# Patient Record
Sex: Male | Born: 1965
Health system: Southern US, Community
[De-identification: ages and names within clinical notes are randomized; demographics above are authoritative.]

## PROBLEM LIST (undated history)

## (undated) DIAGNOSIS — Z720 Tobacco use: Secondary | ICD-10-CM

## (undated) DIAGNOSIS — R002 Palpitations: Secondary | ICD-10-CM

## (undated) DIAGNOSIS — I10 Essential (primary) hypertension: Secondary | ICD-10-CM

## (undated) HISTORY — PX: LAPAROSCOPIC CHOLECYSTECTOMY: SUR755

## (undated) HISTORY — DX: Tobacco use: Z72.0

## (undated) HISTORY — DX: Essential (primary) hypertension: I10

## (undated) HISTORY — DX: Palpitations: R00.2

---

## 2008-04-11 ENCOUNTER — Emergency Department: Payer: Self-pay | Admitting: Emergency Medicine

## 2008-04-12 ENCOUNTER — Encounter: Payer: Self-pay | Admitting: Family Medicine

## 2008-04-14 ENCOUNTER — Encounter: Payer: Self-pay | Admitting: Family Medicine

## 2008-04-15 ENCOUNTER — Encounter: Payer: Self-pay | Admitting: Family Medicine

## 2008-04-17 ENCOUNTER — Encounter: Payer: Self-pay | Admitting: Family Medicine

## 2008-04-24 ENCOUNTER — Ambulatory Visit: Payer: Self-pay | Admitting: Family Medicine

## 2008-04-24 DIAGNOSIS — J309 Allergic rhinitis, unspecified: Secondary | ICD-10-CM | POA: Insufficient documentation

## 2008-04-24 DIAGNOSIS — R002 Palpitations: Secondary | ICD-10-CM | POA: Insufficient documentation

## 2008-04-24 DIAGNOSIS — F17200 Nicotine dependence, unspecified, uncomplicated: Secondary | ICD-10-CM | POA: Insufficient documentation

## 2008-04-29 ENCOUNTER — Encounter: Payer: Self-pay | Admitting: Family Medicine

## 2008-07-01 ENCOUNTER — Ambulatory Visit: Payer: Self-pay | Admitting: Family Medicine

## 2008-07-02 ENCOUNTER — Telehealth (INDEPENDENT_AMBULATORY_CARE_PROVIDER_SITE_OTHER): Payer: Self-pay | Admitting: *Deleted

## 2008-07-02 LAB — CONVERTED CEMR LAB
AST: 18 units/L (ref 0–37)
Alkaline Phosphatase: 51 units/L (ref 39–117)
Basophils Absolute: 0 10*3/uL (ref 0.0–0.1)
Chloride: 105 meq/L (ref 96–112)
Eosinophils Absolute: 0.1 10*3/uL (ref 0.0–0.7)
Eosinophils Relative: 1.3 % (ref 0.0–5.0)
GFR calc non Af Amer: 86 mL/min
Lymphocytes Relative: 34.1 % (ref 12.0–46.0)
MCHC: 35 g/dL (ref 30.0–36.0)
MCV: 97.3 fL (ref 78.0–100.0)
Neutrophils Relative %: 55.6 % (ref 43.0–77.0)
Platelets: 191 10*3/uL (ref 150–400)
Potassium: 4.3 meq/L (ref 3.5–5.1)
Total Bilirubin: 0.6 mg/dL (ref 0.3–1.2)
WBC: 5.8 10*3/uL (ref 4.5–10.5)

## 2008-07-11 ENCOUNTER — Ambulatory Visit: Payer: Self-pay | Admitting: Family Medicine

## 2008-07-11 DIAGNOSIS — J019 Acute sinusitis, unspecified: Secondary | ICD-10-CM | POA: Insufficient documentation

## 2008-07-25 ENCOUNTER — Ambulatory Visit: Payer: Self-pay | Admitting: Family Medicine

## 2008-07-26 ENCOUNTER — Encounter: Payer: Self-pay | Admitting: Family Medicine

## 2011-04-27 ENCOUNTER — Encounter: Payer: Self-pay | Admitting: Family Medicine

## 2011-04-27 ENCOUNTER — Ambulatory Visit (INDEPENDENT_AMBULATORY_CARE_PROVIDER_SITE_OTHER): Payer: Commercial Managed Care - PPO | Admitting: Family Medicine

## 2011-04-27 VITALS — BP 120/72 | HR 70 | Temp 97.9°F | Ht 72.0 in | Wt 230.0 lb

## 2011-04-27 DIAGNOSIS — J0100 Acute maxillary sinusitis, unspecified: Secondary | ICD-10-CM

## 2011-04-27 MED ORDER — AMOXICILLIN 500 MG PO CAPS: 1000.0000 mg | ORAL_CAPSULE | Freq: Two times a day (BID) | ORAL | Status: AC

## 2011-04-27 NOTE — Progress Notes (Signed)
  Subjective:    Patient ID: Nathan Santos, male    DOB: [birth year], 45 y.o.   MRN: [id number]  HPI  Nathan Santos, a 45 y.o. male presents today in the office for the following:    3 weeks has felt sick. Has a lot of sinus pressure. Took some claritin d this morning. Decongestants will make him jittery. Mostly with R ear pain and the face. Has had some sinus presure for a few weeks. Has had some fever.  Also had some cold feeling.   The PMH, PSH, Social History, Family History, Medications, and allergies have been reviewed in [facility], and have been updated if relevant.   Review of Systems     Objective:   Physical Exam        Assessment & Plan:   Subjective:     Nathan Santos is a 45 y.o. male who presents for evaluation of sinus pain. Symptoms include: congestion, epistaxis, facial pain, fevers, foul rhinorrhea, frequent clearing of the throat, headaches, mouth breathing, nasal congestion, post nasal drip, puffiness of the eyes, sinus pressure, sneezing, sniffing and tooth pain. Onset of symptoms was 3 weeks ago. Symptoms have been gradually worsening since that time. Past history is significant for no history of pneumonia or bronchitis. Patient is a non-smoker.  The following portions of the patient's history were reviewed and updated as appropriate: allergies, current medications, past family history, past medical history, past social history, past surgical history and problem list.  Review of Systems Pertinent items are noted in HPI.   Objective:    Physical Exam  Blood pressure 120/72, pulse 70, temperature 97.9 F (36.6 C), temperature source Oral, height 6' (1.829 m), weight 230 lb (104.327 kg), SpO2 98.00%.  Gen: WDWN, NAD; alert,appropriate and cooperative throughout exam  HEENT: Normocephalic and atraumatic. Throat clear, w/o exudate, no LAD, R TM clear, L TM - good landmarks, No fluid present. rhinnorhea.  Left frontal and maxillary sinuses: Tender max Right  frontal and maxillary sinuses: Tendermax  Neck: No ant or post LAD CV: RRR, No M/G/R Pulm: Breathing comfortably in no resp distress. no w/c/r Abd: S,NT,ND,+BS Extr: no c/c/e Psych: full affect, pleasant    Assessment:    Acute bacterial sinusitis.    Plan:    Nasal saline sprays. Amoxicillin per medication orders.

## 2011-09-02 ENCOUNTER — Other Ambulatory Visit: Payer: Self-pay | Admitting: Family Medicine

## 2011-09-02 DIAGNOSIS — Z79899 Other long term (current) drug therapy: Secondary | ICD-10-CM

## 2011-09-02 DIAGNOSIS — Z125 Encounter for screening for malignant neoplasm of prostate: Secondary | ICD-10-CM

## 2011-09-02 DIAGNOSIS — Z13220 Encounter for screening for lipoid disorders: Secondary | ICD-10-CM

## 2011-09-08 ENCOUNTER — Other Ambulatory Visit (INDEPENDENT_AMBULATORY_CARE_PROVIDER_SITE_OTHER): Payer: Commercial Managed Care - PPO

## 2011-09-08 DIAGNOSIS — Z79899 Other long term (current) drug therapy: Secondary | ICD-10-CM

## 2011-09-08 DIAGNOSIS — Z13220 Encounter for screening for lipoid disorders: Secondary | ICD-10-CM

## 2011-09-08 DIAGNOSIS — Z125 Encounter for screening for malignant neoplasm of prostate: Secondary | ICD-10-CM

## 2011-09-08 LAB — LIPID PANEL
Cholesterol: 192 mg/dL (ref 0–200)
LDL Cholesterol: 124 mg/dL — ABNORMAL HIGH (ref 0–99)
Total CHOL/HDL Ratio: 3
Triglycerides: 51 mg/dL (ref 0.0–149.0)

## 2011-09-08 LAB — CBC WITH DIFFERENTIAL/PLATELET
Basophils Absolute: 0 10*3/uL (ref 0.0–0.1)
Eosinophils Absolute: 0.1 10*3/uL (ref 0.0–0.7)
HCT: 48.5 % (ref 39.0–52.0)
Lymphs Abs: 2.1 10*3/uL (ref 0.7–4.0)
MCHC: 33.6 g/dL (ref 30.0–36.0)
MCV: 98.6 fl (ref 78.0–100.0)
Monocytes Absolute: 0.7 10*3/uL (ref 0.1–1.0)
Neutrophils Relative %: 70.7 % (ref 43.0–77.0)
Platelets: 193 10*3/uL (ref 150.0–400.0)
RDW: 13.5 % (ref 11.5–14.6)

## 2011-09-08 LAB — BASIC METABOLIC PANEL
BUN: 17 mg/dL (ref 6–23)
Chloride: 105 mEq/L (ref 96–112)
Creatinine, Ser: 0.7 mg/dL (ref 0.4–1.5)
Glucose, Bld: 107 mg/dL — ABNORMAL HIGH (ref 70–99)
Potassium: 4.7 mEq/L (ref 3.5–5.1)

## 2011-09-08 LAB — HEPATIC FUNCTION PANEL
AST: 19 U/L (ref 0–37)
Albumin: 4.2 g/dL (ref 3.5–5.2)
Alkaline Phosphatase: 70 U/L (ref 39–117)
Bilirubin, Direct: 0.1 mg/dL (ref 0.0–0.3)

## 2011-09-15 ENCOUNTER — Ambulatory Visit (INDEPENDENT_AMBULATORY_CARE_PROVIDER_SITE_OTHER): Payer: Commercial Managed Care - PPO | Admitting: Family Medicine

## 2011-09-15 ENCOUNTER — Encounter: Payer: Self-pay | Admitting: Family Medicine

## 2011-09-15 VITALS — BP 130/90 | HR 70 | Temp 98.4°F | Ht 71.0 in | Wt 243.8 lb

## 2011-09-15 DIAGNOSIS — Z0000 Encounter for general adult medical examination without abnormal findings: Secondary | ICD-10-CM

## 2011-09-15 MED ORDER — FLUTICASONE PROPIONATE 50 MCG/ACT NA SUSP: 2.0000 | Freq: Every day | NASAL | Status: DC

## 2011-09-15 MED ORDER — TADALAFIL 20 MG PO TABS: 20.0000 mg | ORAL_TABLET | Freq: Every day | ORAL | Status: AC | PRN

## 2011-09-15 NOTE — Progress Notes (Signed)
Patient Name: Nathan Santos Date of Birth: [birth year] Medical Record Number: [id number] Gender: male Date of Encounter: 09/15/2011  History of Present Illness:  Nathan Santos is a 46 y.o. very pleasant male patient who presents with the following:  Has been going on a separation and divorce and is feeling bad about all of the time. About a month ago, and saw some was given some biaxin, but never really gotten rid of it. Has some low energy.  6 months down and lower. Has been down some. Has some spells of depression, has been ongoing for about two years. Was exercising well, but since his energy down  218 - 243. Has all been put on in the last three months. Still has some congestion.  Wakes up 2 AM or ealier, and has a lot of stress at work. Will be on call  A lot. Works up to 55 hours a week. Up at 430 in the AM  Preventative Health Maintenance Visit:  Health Maintenance Summary Reviewed and updated, unless pt declines services.  Tobacco History Reviewed. Alcohol: No concerns, no excessive use Exercise Habits: rare STD concerns: no risk or activity to increase risk Drug Use: None Encouraged self-testicular check  Health Maintenance  Topic Date Due  . Influenza Vaccine  03/22/2011  . Tetanus/tdap  11/19/2017    Labs reviewed with the patient.   Lipids:    Component Value Date/Time   CHOL 192 09/08/2011 0802   TRIG 51.0 09/08/2011 0802   HDL 58.10 09/08/2011 0802   VLDL 10.2 09/08/2011 0802   CHOLHDL 3 09/08/2011 0802    CBC:    Component Value Date/Time   WBC 10.1 09/08/2011 0802   HGB 16.3 09/08/2011 0802   HCT 48.5 09/08/2011 0802   PLT 193.0 09/08/2011 0802   MCV 98.6 09/08/2011 0802   NEUTROABS 7.1 09/08/2011 0802   LYMPHSABS 2.1 09/08/2011 0802   MONOABS 0.7 09/08/2011 0802   EOSABS 0.1 09/08/2011 0802   BASOSABS 0.0 09/08/2011 0802    Basic Metabolic Panel:    Component Value Date/Time   NA 139 09/08/2011 0802   K 4.7 09/08/2011 0802   CL 105 09/08/2011 0802   CO2  27 09/08/2011 0802   BUN 17 09/08/2011 0802   CREATININE 0.7 09/08/2011 0802   GLUCOSE 107* 09/08/2011 0802   CALCIUM 9.6 09/08/2011 0802    Lab Results  Component Value Date   ALT 23 09/08/2011   AST 19 09/08/2011   ALKPHOS 70 09/08/2011   BILITOT 0.5 09/08/2011    Lab Results  Component Value Date   PSA 1.56 09/08/2011   PSA 0.90 07/01/2008     Patient Active Problem List  Diagnoses  . TOBACCO USE  . HYPERTENSION  . ALLERGIC RHINITIS  . PALPITATIONS   Past Medical History  Diagnosis Date  . Allergic rhinitis   . Palpitations   . Hypertension   . Tobacco abuse    Past Surgical History  Procedure Date  . Cholectomy   . Cardiovascular stress test   . US echocardiography    History  Substance Use Topics  . Smoking status: Former Games developer  . Smokeless tobacco: Not on file  . Alcohol Use: Yes   Family History  Problem Relation Age of Onset  . Heart disease Father    No Known Allergies  Medication list has been reviewed and updated.  Review of Systems:  General: Denies fever, chills, sweats. No significant weight loss. Some fatigue Eyes: Denies blurring,significant itching ENT: Denies earache,  sore throat, and hoarseness. STUFFINESS AND SINUS SYMPTOMS Cardiovascular: Denies chest pains, palpitations, dyspnea on exertion Respiratory: Denies cough, dyspnea at rest,wheeezing Breast: no concerns about lumps GI: Denies nausea, vomiting, diarrhea, constipation, change in bowel habits, abdominal pain, melena, hematochezia GU: Denies penile discharge, occ ED AND DECREASED LIBIDO, no urinary flow / outflow problems. No STD concerns. Musculoskeletal: Denies back pain, joint pain Derm: Denies rash, itching Neuro: Denies  paresthesias, frequent falls, frequent headaches Psych: has had some mild dep with sep and divorce, no si or hi. Doing thinigs with friends Endocrine: Denies cold intolerance, heat intolerance, polydipsia Heme: Denies enlarged lymph nodes Allergy: No  hayfever   Physical Examination: Filed Vitals:   09/15/11 1443  BP: 130/90  Pulse: 70  Temp: 98.4 F (36.9 C)  TempSrc: Oral  Height: 5\' 11"  (1.803 m)  Weight: 243 lb 12.8 oz (110.587 kg)  SpO2: 98%    Body mass index is 34.00 kg/(m^2).   Wt Readings from Last 3 Encounters:  09/15/11 243 lb 12.8 oz (110.587 kg)  04/27/11 230 lb (104.327 kg)  07/25/08 264 lb (119.75 kg)    GEN: well developed, well nourished, no acute distress Eyes: conjunctiva and lids normal, PERRLA, EOMI ENT: TM clear, nares clear, oral exam WNL Neck: supple, no lymphadenopathy, no thyromegaly, no JVD Pulm: clear to auscultation and percussion, respiratory effort normal CV: regular rate and rhythm, S1-S2, no murmur, rub or gallop, no bruits, peripheral pulses normal and symmetric, no cyanosis, clubbing, edema or varicosities Chest: no scars, masses GI: soft, non-tender; no hepatosplenomegaly, masses; active bowel sounds all quadrants GU: no hernia, testicular mass, penile discharge, or prostate enlargement Lymph: no cervical, axillary or inguinal adenopathy MSK: gait normal, muscle tone and strength WNL, no joint swelling, effusions, discoloration, crepitus  SKIN: clear, good turgor, color WNL, no rashes, lesions, or ulcerations Neuro: normal mental status, normal strength, sensation, and motion Psych: alert; oriented to person, place and time, normally interactive and not anxious or depressed in appearance.   Assessment and Plan:  1. Routine general medical examination at a health care facility    The patient's preventative maintenance and recommended screening tests for an annual wellness exam were reviewed in full today. Brought up to date unless services declined.  Counselled on the importance of diet, exercise, and its role in overall health and mortality. The patient's FH and SH was reviewed, including their home life, tobacco status, and drug and alcohol status.  Discussed increasing  activity.   Some fatigue. Decreased libido. We will do a trial of some Cialis. He asked and raised a question about testosterone. After discussing this, potential risks and benefits, for now we're not going to check his levels.  AR: start flonase and switch to allegra or zyrtec  Orders Today: No orders of the defined types were placed in this encounter.    Medications Today: Meds ordered this encounter  Medications  . loratadine (CLARITIN) 10 MG tablet    Sig: Take 10 mg by mouth daily.  . tadalafil (CIALIS) 20 MG tablet    Sig: Take 1 tablet (20 mg total) by mouth daily as needed for erectile dysfunction.    Dispense:  10 tablet    Refill:  11  . fluticasone (FLONASE) 50 MCG/ACT nasal spray    Sig: Place 2 sprays into the nose daily.    Dispense:  16 g    Refill:  12

## 2011-09-15 NOTE — Patient Instructions (Signed)
SWITCH TO ALLEGRA OR ZYRTEC

## 2011-09-17 ENCOUNTER — Encounter: Payer: Self-pay | Admitting: Family Medicine

## 2013-02-01 ENCOUNTER — Encounter: Payer: Self-pay | Admitting: Family Medicine

## 2013-02-01 ENCOUNTER — Ambulatory Visit (INDEPENDENT_AMBULATORY_CARE_PROVIDER_SITE_OTHER): Payer: Commercial Managed Care - PPO | Admitting: Family Medicine

## 2013-02-01 VITALS — BP 158/80 | HR 72 | Temp 98.4°F | Ht 71.0 in | Wt 256.0 lb

## 2013-02-01 DIAGNOSIS — IMO0002 Reserved for concepts with insufficient information to code with codable children: Secondary | ICD-10-CM

## 2013-02-01 DIAGNOSIS — M5416 Radiculopathy, lumbar region: Secondary | ICD-10-CM

## 2013-02-01 MED ORDER — PREDNISONE 20 MG PO TABS: ORAL_TABLET | ORAL | Status: DC

## 2013-02-01 NOTE — Progress Notes (Signed)
  Subjective:    Patient ID: Nathan Santos, male    DOB: 1966-06-08, 47 y.o.   MRN: [id number]  HPI 47 year old male pt of Dr. Cyndie Chime presents with new right lower back pain off and on in last 3 month, now 2 days ago, sudden worsening of back pain. Advil previously was helping, but now it is no longer helping.  Pain is 8/10 on pain scale.  Cannot get comfortable. Better if standing, worse if lying flat.    Now numbness tingling in right foot, now pain radiating into right buttock and right foot.  No incontinence. Right leg feels weaker.   No fever. No known injury trigger.  Has history of low back pain intermittantly, usually goes aeway on its own.   Review of Systems  Constitutional: Negative for fever and fatigue.  HENT: Negative for ear pain.   Eyes: Negative for pain.  Respiratory: Negative for shortness of breath.   Cardiovascular: Negative for chest pain.       Objective:   Physical Exam  Constitutional: Vital signs are normal. He appears well-developed and well-nourished.  HENT:  Head: Normocephalic.  Right Ear: Hearing normal.  Left Ear: Hearing normal.  Nose: Nose normal.  Mouth/Throat: Oropharynx is clear and moist and mucous membranes are normal.  Neck: Trachea normal. Carotid bruit is not present. No mass and no thyromegaly present.  Cardiovascular: Normal rate, regular rhythm and normal pulses.  Exam reveals no gallop, no distant heart sounds and no friction rub.   No murmur heard. No peripheral edema  Pulmonary/Chest: Effort normal and breath sounds normal. No respiratory distress.  Musculoskeletal:       Lumbar back: He exhibits decreased range of motion and tenderness. He exhibits no bony tenderness, no swelling and no deformity.  Positive SLR on right, neg faber's B  Neurological: He has normal strength and normal reflexes. No cranial nerve deficit or sensory deficit. He exhibits normal muscle tone. Gait abnormal. Coordination normal.  Skin: Skin is  warm, dry and intact. No rash noted.  Psychiatric: He has a normal mood and affect. His speech is normal and behavior is normal. Thought content normal.          Assessment & Plan:

## 2013-02-01 NOTE — Patient Instructions (Addendum)
Remain out of work until Monday. 8/18. Upon return no lifting greater than 10 lbs, no repetitive twisting.  Start home physical therapy, Heat on back and steroid course.  Call if not improving as expected or if symptoms not resolved in 2 weeks F/U with PCP.

## 2013-02-08 DIAGNOSIS — M5416 Radiculopathy, lumbar region: Secondary | ICD-10-CM | POA: Insufficient documentation

## 2013-02-08 NOTE — Assessment & Plan Note (Signed)
Remain out of work until Monday. 8/18. Upon return no lifting greater than 10 lbs, no repetitive twisting.  Start home physical therapy, Heat on back and steroid course.  Call if not improving as expected or if symptoms not resolved in 2 weeks F/U with PCP. 

## 2013-07-26 ENCOUNTER — Observation Stay: Payer: Self-pay | Admitting: Internal Medicine

## 2013-07-26 LAB — CBC
HCT: 47.8 % (ref 40.0–52.0)
HGB: 16.1 g/dL (ref 13.0–18.0)
MCH: 32.5 pg (ref 26.0–34.0)
MCHC: 33.8 g/dL (ref 32.0–36.0)
MCV: 96 fL (ref 80–100)
Platelet: 234 10*3/uL (ref 150–440)
RBC: 4.97 10*6/uL (ref 4.40–5.90)
RDW: 13.2 % (ref 11.5–14.5)
WBC: 7.3 10*3/uL (ref 3.8–10.6)

## 2013-07-26 LAB — BASIC METABOLIC PANEL
ANION GAP: 8 (ref 7–16)
BUN: 15 mg/dL (ref 7–18)
CHLORIDE: 105 mmol/L (ref 98–107)
Calcium, Total: 9.2 mg/dL (ref 8.5–10.1)
Co2: 24 mmol/L (ref 21–32)
Creatinine: 0.81 mg/dL (ref 0.60–1.30)
Glucose: 94 mg/dL (ref 65–99)
OSMOLALITY: 274 (ref 275–301)
Potassium: 4.4 mmol/L (ref 3.5–5.1)
SODIUM: 137 mmol/L (ref 136–145)

## 2013-07-26 LAB — TROPONIN I: Troponin-I: <0.02 ng/mL

## 2013-07-27 DIAGNOSIS — R079 Chest pain, unspecified: Secondary | ICD-10-CM

## 2013-07-27 LAB — LIPID PANEL
CHOLESTEROL: 192 mg/dL (ref 0–200)
HDL: 47 mg/dL (ref 40–60)
LDL CHOLESTEROL, CALC: 117 mg/dL — AB (ref 0–100)
Triglycerides: 142 mg/dL (ref 0–200)
VLDL Cholesterol, Calc: 28 mg/dL (ref 5–40)

## 2013-07-27 LAB — TROPONIN I

## 2013-08-06 ENCOUNTER — Encounter: Payer: Self-pay | Admitting: Family Medicine

## 2013-08-06 ENCOUNTER — Ambulatory Visit (INDEPENDENT_AMBULATORY_CARE_PROVIDER_SITE_OTHER): Payer: Commercial Managed Care - PPO | Admitting: Family Medicine

## 2013-08-06 VITALS — BP 144/80 | HR 72 | Temp 97.5°F | Ht 71.0 in | Wt 263.5 lb

## 2013-08-06 DIAGNOSIS — I10 Essential (primary) hypertension: Secondary | ICD-10-CM

## 2013-08-06 DIAGNOSIS — R079 Chest pain, unspecified: Secondary | ICD-10-CM

## 2013-08-06 NOTE — Progress Notes (Signed)
Pre-visit discussion using our clinic review tool. No additional management support is needed unless otherwise documented below in the visit note.  

## 2013-08-07 NOTE — Progress Notes (Signed)
Date:  08/06/2013   Name:  Nathan Santos   DOB:  02/15/1966   MRN:  [id number] Gender: male Age: 48 y.o.  Primary Physician:  Owens Loffler, MD   Chief Complaint: Hospitalization Follow-up   Subjective:   History of Present Illness:  Nathan Santos is a 48 y.o. pleasant patient who presents with the following:  Recent admission for chest pain rule out at [facility] within the last 2 weeks. D/C summary reviewed. S/p CE neg x 3, nuclear stress test, exercise that was low risk and otherwise normal. D/c with dx of MSK chest pain.  He had been doing a lot of strenuous activity prior to admission.   BP has been elevated persistently, he has gained weight, and he has been resistant to go on any medication.  Patient Active Problem List   Diagnosis Date Noted  . Acute radicular low back pain 02/08/2013  . TOBACCO USE 04/24/2008  . HYPERTENSION 04/24/2008  . ALLERGIC RHINITIS 04/24/2008    Past Medical History  Diagnosis Date  . Allergic rhinitis   . Palpitations   . Hypertension   . Tobacco abuse     Past Surgical History  Procedure Laterality Date  . Laparoscopic cholecystectomy      History   Social History  . Marital Status: Married    Spouse Name: N/A    Number of Children: 1  . Years of Education: N/A   Occupational History  . PLASTICS RECYCLING    Social History Main Topics  . Smoking status: Former Smoker    Quit date: 08/06/2010  . Smokeless tobacco: Not on file  . Alcohol Use: Yes  . Drug Use: No  . Sexual Activity: Not on file   Other Topics Concern  . Not on file   Social History Narrative   REGULAR EXERCISE- YES    Family History  Problem Relation Age of Onset  . Heart disease Father     No Known Allergies  Medication list has been reviewed and updated.  Review of Systems:   GEN: No acute illnesses, no fevers, chills. GI: No n/v/d, eating normally Pulm: No SOB Interactive and getting along well at home.  Otherwise, ROS is as per  the HPI.  Objective:   Physical Examination: BP 144/80  Pulse 72  Temp(Src) 97.5 F (36.4 C) (Oral)  Ht 5\' 11"  (1.803 m)  Wt 263 lb 8 oz (119.523 kg)  BMI 36.77 kg/m2  SpO2 94%  Ideal Body Weight: Weight in (lb) to have BMI = 25: 178.9   GEN: WDWN, NAD, Non-toxic, A & O x 3 HEENT: Atraumatic, Normocephalic. Neck supple. No masses, No LAD. Ears and Nose: No external deformity. CV: RRR, No M/G/R. No JVD. No thrill. No extra heart sounds. PULM: CTA B, no wheezes, crackles, rhonchi. No retractions. No resp. distress. No accessory muscle use. EXTR: No c/c/e NEURO Normal gait.  PSYCH: Normally interactive. Conversant. Not depressed or anxious appearing.  Calm demeanor.   Laboratory and Imaging Data:  Assessment & Plan:    Chest pain  HYPERTENSION  MSK chest pain HTN, trial of weight loss, f/u spring/summer  Signed,  Timberly Yott T. Kaymarie Wynn, MD, [place] [facility] [place] [address] Phone: [phone] Fax: [phone]    Medication List       This list is accurate as of: 08/06/13 11:59 PM.  Always use your most recent med list.  CLARITIN 10 MG tablet  Generic drug:  loratadine  Take 10 mg by mouth daily.     multivitamin tablet  Take 1 tablet by mouth daily.

## 2013-08-08 ENCOUNTER — Telehealth: Payer: Self-pay | Admitting: Family Medicine

## 2013-08-08 NOTE — Telephone Encounter (Signed)
Relevant patient education mailed to patient.  

## 2013-12-07 ENCOUNTER — Encounter: Payer: Self-pay | Admitting: Family Medicine

## 2013-12-07 ENCOUNTER — Ambulatory Visit (INDEPENDENT_AMBULATORY_CARE_PROVIDER_SITE_OTHER)
Admission: RE | Admit: 2013-12-07 | Discharge: 2013-12-07 | Disposition: A | Discharge status: Home / Self Care | Payer: Commercial Managed Care - PPO | Source: Ambulatory Visit | Attending: Family Medicine | Admitting: Family Medicine

## 2013-12-07 ENCOUNTER — Ambulatory Visit (INDEPENDENT_AMBULATORY_CARE_PROVIDER_SITE_OTHER): Payer: Commercial Managed Care - PPO | Admitting: Family Medicine

## 2013-12-07 VITALS — BP 150/80 | HR 70 | Temp 98.3°F | Wt 255.0 lb

## 2013-12-07 DIAGNOSIS — M25561 Pain in right knee: Secondary | ICD-10-CM

## 2013-12-07 DIAGNOSIS — M25569 Pain in unspecified knee: Secondary | ICD-10-CM

## 2013-12-07 MED ORDER — DICLOFENAC SODIUM 75 MG PO TBEC: 75.0000 mg | DELAYED_RELEASE_TABLET | Freq: Two times a day (BID) | ORAL | Status: DC

## 2013-12-07 MED ORDER — HYDROCODONE-ACETAMINOPHEN 5-325 MG PO TABS: 1.0000 | ORAL_TABLET | Freq: Four times a day (QID) | ORAL | Status: DC | PRN

## 2013-12-07 NOTE — Progress Notes (Signed)
Pre visit review using our clinic review tool, if applicable. No additional management support is needed unless otherwise documented below in the visit note. 

## 2013-12-07 NOTE — Progress Notes (Signed)
   Subjective:    Patient ID: Rushawn Capshaw, male    DOB: 1965/09/11, 48 y.o.   MRN: [id number]  Knee Pain      48 year old male pt of Dr. Lillie Fragmin presents with new onset  Right knee pain. He noted pain after twisting right knee after weed eating.  He has been wearing ace bandage and using sports cream. He had mild pain until yesterday.  Right knee gave out. Now pain much worse and swelling and redness in anterior knee. Hears grinding and increased pain with flexion of knee. Popping felt with bending knee.  Has taken motrin 2-3  for pain which has not helped.  No past knee surgery or past injuries.      Review of Systems  Constitutional: Negative for fever and fatigue.  HENT: Negative for ear pain.   Eyes: Negative for pain.  Respiratory: Negative for cough and shortness of breath.   Cardiovascular: Negative for chest pain and leg swelling.  Gastrointestinal: Negative for abdominal pain.       Objective:   Physical Exam  Constitutional: Vital signs are normal. He appears well-developed and well-nourished.  HENT:  Head: Normocephalic.  Right Ear: Hearing normal.  Left Ear: Hearing normal.  Nose: Nose normal.  Mouth/Throat: Oropharynx is clear and moist and mucous membranes are normal.  Neck: Trachea normal. Carotid bruit is not present. No mass and no thyromegaly present.  Cardiovascular: Normal rate, regular rhythm and normal pulses.  Exam reveals no gallop, no distant heart sounds and no friction rub.   No murmur heard. No peripheral edema  Pulmonary/Chest: Effort normal and breath sounds normal. No respiratory distress.  Musculoskeletal:       Right knee: He exhibits decreased range of motion, swelling and abnormal meniscus. He exhibits no effusion and no bony tenderness. Tenderness found. Medial joint line tenderness noted. No MCL, no LCL and no patellar tendon tenderness noted.  Positive Mc Murray's  Skin: Skin is warm, dry and intact. No rash noted.    Psychiatric: He has a normal mood and affect. His speech is normal and behavior is normal. Thought content normal.          Assessment & Plan:

## 2013-12-07 NOTE — Addendum Note (Signed)
Addended by: Eliezer Lofts E on: 12/07/2013 10:54 AM   Modules accepted: Orders

## 2013-12-07 NOTE — Patient Instructions (Addendum)
  Work restrictions: Avoid positions and activities that place excessive pressure on the knee joint until pain and swelling resolve. Such activities include: squatting, kneeling, twisting and pivoting, repetitive bending   No heavy lifting.  Apply ice to the knee for 15 minutes every four to six hours, while keeping the leg elevated   Start diclofenac twice daily for inflammation and pain. Use vicodin for breakthrough pain   Follow up with Dr. Diona Browner or Dr. Lorelei Pont in 2 weeks, call sooner if pain severe.

## 2013-12-07 NOTE — Assessment & Plan Note (Signed)
Concerning for meniscal tear given positive McMurray's. Will eval with x-ray. Start NSAID and vicodin for breakthrough pain.  Wear knee immobilizer.  Follow up in 2 weeks, call eaerlier if pain not improving.

## 2013-12-17 ENCOUNTER — Other Ambulatory Visit: Payer: Self-pay

## 2013-12-17 MED ORDER — HYDROCODONE-ACETAMINOPHEN 5-325 MG PO TABS: 1.0000 | ORAL_TABLET | Freq: Four times a day (QID) | ORAL | Status: DC | PRN

## 2013-12-17 NOTE — Telephone Encounter (Signed)
Pt left v/m requesting rx hydrocodone apap for knee pain. Call when ready for pick up.

## 2013-12-17 NOTE — Telephone Encounter (Signed)
Left message for Agustine that prescription is ready to be picked up at front desk.

## 2013-12-19 ENCOUNTER — Ambulatory Visit: Payer: Commercial Managed Care - PPO | Admitting: Family Medicine

## 2013-12-26 ENCOUNTER — Ambulatory Visit: Payer: Commercial Managed Care - PPO | Admitting: Family Medicine

## 2014-04-24 ENCOUNTER — Encounter: Payer: Commercial Managed Care - PPO | Admitting: Family Medicine

## 2014-06-03 ENCOUNTER — Encounter: Payer: Self-pay | Admitting: *Deleted

## 2014-06-03 ENCOUNTER — Ambulatory Visit (INDEPENDENT_AMBULATORY_CARE_PROVIDER_SITE_OTHER)
Admission: RE | Admit: 2014-06-03 | Discharge: 2014-06-03 | Disposition: A | Discharge status: Home / Self Care | Payer: Commercial Managed Care - PPO | Source: Ambulatory Visit | Attending: Family Medicine | Admitting: Family Medicine

## 2014-06-03 ENCOUNTER — Encounter: Payer: Self-pay | Admitting: Family Medicine

## 2014-06-03 ENCOUNTER — Ambulatory Visit (INDEPENDENT_AMBULATORY_CARE_PROVIDER_SITE_OTHER): Payer: Commercial Managed Care - PPO | Admitting: Family Medicine

## 2014-06-03 VITALS — BP 130/78 | HR 78 | Temp 99.2°F | Wt 258.5 lb

## 2014-06-03 DIAGNOSIS — R1031 Right lower quadrant pain: Secondary | ICD-10-CM

## 2014-06-03 LAB — POCT URINALYSIS DIPSTICK
Bilirubin, UA: NEGATIVE
Glucose, UA: NEGATIVE
Ketones, UA: NEGATIVE
LEUKOCYTES UA: NEGATIVE
NITRITE UA: NEGATIVE
PROTEIN UA: POSITIVE
Spec Grav, UA: >=1.03
Urobilinogen, UA: 0.2
pH, UA: 6

## 2014-06-03 MED ORDER — NAPROXEN 500 MG PO TABS: ORAL_TABLET | ORAL | Status: DC

## 2014-06-03 MED ORDER — TAMSULOSIN HCL 0.4 MG PO CAPS: 0.4000 mg | ORAL_CAPSULE | Freq: Every day | ORAL | Status: DC

## 2014-06-03 NOTE — Patient Instructions (Addendum)
Urine check today and labwork today. I think you have kidney stone. Push water, may use naprosyn twice daily with food for discomfort. Also may start flomax which may help pass stone quicker. Xray of kidneys today. If worsening pain please seek urgent care at ER. If not improving with above treatment, call us for CT scan.

## 2014-06-03 NOTE — Progress Notes (Signed)
BP 130/78 mmHg  Pulse 78  Temp(Src) 99.2 F (37.3 C) (Oral)  Wt 258 lb 8 oz (117.255 kg)   CC: R abd pain  Subjective:    Patient ID: Nathan Santos, male    DOB: 05-May-1966, 48 y.o.   MRN: [id number]  HPI: Nathan Santos is a 48 y.o. male presenting on 06/03/2014 for pain lower right side   3-4d h/o sharp RLQ pain with radiation to groin. Stayed uncomfortable all weekend - this morning progressively worsened. This has been associated with nausea. Initially felt feverish and had diarrhea but that has since resolved. He does endorse more irregular stools over last 2 weeks. Some back pain - earlier in the week was more active in yard bagging leaves. Wonders if he pulled a muscle.  No fevers/chills, blood in stool, blood in urine, urgency, hematuria, frequency, dysuria, vomiting.   H/o laparoscopic cholecystectomy for gallstones - this feels similar. No h/o kidney stones. No fmhx colon cancer  Relevant past medical, surgical, family and social history reviewed and updated as indicated. Interim medical history since our last visit reviewed. Allergies and medications reviewed and updated.  Current Outpatient Prescriptions on File Prior to Visit  Medication Sig  . Multiple Vitamin (MULTIVITAMIN) tablet Take 1 tablet by mouth daily.    . diclofenac (VOLTAREN) 75 MG EC tablet Take 1 tablet (75 mg total) by mouth 2 (two) times daily. (Patient not taking: Reported on 06/03/2014)  . HYDROcodone-acetaminophen (NORCO/VICODIN) 5-325 MG per tablet Take 1 tablet by mouth every 6 (six) hours as needed for moderate pain. (Patient not taking: Reported on 06/03/2014)  . loratadine (CLARITIN) 10 MG tablet Take 10 mg by mouth daily.   No current facility-administered medications on file prior to visit.    Review of Systems Per HPI unless specifically indicated above     Objective:    BP 130/78 mmHg  Pulse 78  Temp(Src) 99.2 F (37.3 C) (Oral)  Wt 258 lb 8 oz (117.255 kg)  Wt Readings  from Last 3 Encounters:  06/03/14 258 lb 8 oz (117.255 kg)  12/07/13 255 lb (115.667 kg)  08/06/13 263 lb 8 oz (119.523 kg)   Physical Exam  Constitutional: He appears well-developed and well-nourished. No distress.  Normal gait (not antalgic), nontoxic.  HENT:  Mouth/Throat: Oropharynx is clear and moist. No oropharyngeal exudate.  Eyes: Conjunctivae and EOM are normal. Pupils are equal, round, and reactive to light. No scleral icterus.  Cardiovascular: Normal rate, regular rhythm, normal heart sounds and intact distal pulses.   No murmur heard. Pulmonary/Chest: Effort normal and breath sounds normal. No respiratory distress. He has no wheezes. He has no rales.  Abdominal: Soft. Normal appearance. He exhibits no distension and no mass. Bowel sounds are increased. There is no hepatosplenomegaly. There is tenderness (moderate) in the right lower quadrant. There is no rigidity, no rebound, no guarding, no CVA tenderness and negative Murphy's sign. No hernia. Hernia confirmed negative in the right inguinal area and confirmed negative in the left inguinal area.  Musculoskeletal: He exhibits no edema.  Skin: Skin is warm and dry. No rash noted.  Psychiatric: He has a normal mood and affect.  Nursing note and vitals reviewed.     Assessment & Plan:   Problem List Items Addressed This Visit    Right lower quadrant abdominal pain - Primary    Does not have an acute abdomen today. Not consistent with acute appendicitis given duration of sxs.  Check UA today - 1+  blood, 3-5 RBC/hpf Check CMP, CBC today. Given hematuria noted today anticipate kidney stone. Treat with flomax and naprosyn anti inflammatory, push fluids. If worsening discussed in detail need to seek ER care. If not improving with treatment, I asked him to contact us to schedule CT scan to further eval RLQ abd pain - eval for diverticulitis vs neoplasm vs other. Pt agrees with plan.  KUB today - no obvious kidney stone noted     Relevant Orders      Comprehensive metabolic panel      CBC with Differential      Urinalysis Dipstick (Completed)      DG Abd 1 View      Follow up plan: No Follow-up on file.

## 2014-06-03 NOTE — Progress Notes (Signed)
Pre visit review using our clinic review tool, if applicable. No additional management support is needed unless otherwise documented below in the visit note. 

## 2014-06-03 NOTE — Assessment & Plan Note (Addendum)
Does not have an acute abdomen today. Not consistent with acute appendicitis given duration of sxs.  Check UA today - 1+ blood, 3-5 RBC/hpf Check CMP, CBC today. Given hematuria noted today anticipate kidney stone. Treat with flomax and naprosyn anti inflammatory, push fluids. If worsening discussed in detail need to seek ER care. If not improving with treatment, I asked him to contact us to schedule CT scan to further eval RLQ abd pain - eval for diverticulitis vs neoplasm vs other. Pt agrees with plan.  KUB today - no obvious kidney stone noted

## 2014-06-04 ENCOUNTER — Other Ambulatory Visit: Payer: Self-pay | Admitting: Family Medicine

## 2014-06-04 ENCOUNTER — Telehealth: Payer: Self-pay | Admitting: Family Medicine

## 2014-06-04 LAB — COMPREHENSIVE METABOLIC PANEL
ALBUMIN: 4.1 g/dL (ref 3.5–5.2)
ALK PHOS: 77 U/L (ref 39–117)
ALT: 18 U/L (ref 0–53)
AST: 14 U/L (ref 0–37)
BUN: 19 mg/dL (ref 6–23)
CALCIUM: 9.5 mg/dL (ref 8.4–10.5)
CHLORIDE: 103 meq/L (ref 96–112)
CO2: 28 mEq/L (ref 19–32)
Creatinine, Ser: 1.1 mg/dL (ref 0.4–1.5)
GFR: 72.96 mL/min (ref >60.00)
GLUCOSE: 93 mg/dL (ref 70–99)
POTASSIUM: 4.4 meq/L (ref 3.5–5.1)
SODIUM: 137 meq/L (ref 135–145)
TOTAL PROTEIN: 7.3 g/dL (ref 6.0–8.3)
Total Bilirubin: 0.5 mg/dL (ref 0.2–1.2)

## 2014-06-04 LAB — CBC WITH DIFFERENTIAL/PLATELET
Basophils Absolute: 0.1 10*3/uL (ref 0.0–0.1)
Basophils Relative: 0.7 % (ref 0.0–3.0)
EOS PCT: 1.6 % (ref 0.0–5.0)
Eosinophils Absolute: 0.2 10*3/uL (ref 0.0–0.7)
HCT: 48.6 % (ref 39.0–52.0)
Hemoglobin: 16 g/dL (ref 13.0–17.0)
LYMPHS ABS: 2.3 10*3/uL (ref 0.7–4.0)
LYMPHS PCT: 15.9 % (ref 12.0–46.0)
MCHC: 33 g/dL (ref 30.0–36.0)
MCV: 97.9 fl (ref 78.0–100.0)
Monocytes Absolute: 0.6 10*3/uL (ref 0.1–1.0)
Monocytes Relative: 4.5 % (ref 3.0–12.0)
NEUTROS ABS: 10.9 10*3/uL — AB (ref 1.4–7.7)
NEUTROS PCT: 77.3 % — AB (ref 43.0–77.0)
PLATELETS: 229 10*3/uL (ref 150.0–400.0)
RBC: 4.96 Mil/uL (ref 4.22–5.81)
RDW: 13.3 % (ref 11.5–15.5)
WBC: 14.1 10*3/uL — ABNORMAL HIGH (ref 4.0–10.5)

## 2014-06-04 MED ORDER — AMOXICILLIN-POT CLAVULANATE 875-125 MG PO TABS: 1.0000 | ORAL_TABLET | Freq: Two times a day (BID) | ORAL | Status: AC

## 2014-06-04 NOTE — Telephone Encounter (Signed)
emmi mailed  °

## 2014-07-01 ENCOUNTER — Encounter: Payer: Self-pay | Admitting: Gastroenterology

## 2014-07-01 ENCOUNTER — Ambulatory Visit (INDEPENDENT_AMBULATORY_CARE_PROVIDER_SITE_OTHER): Payer: Commercial Managed Care - PPO | Admitting: Family Medicine

## 2014-07-01 ENCOUNTER — Encounter: Payer: Self-pay | Admitting: Family Medicine

## 2014-07-01 VITALS — BP 130/78 | HR 71 | Temp 97.7°F | Ht 69.5 in | Wt 257.5 lb

## 2014-07-01 DIAGNOSIS — Z1211 Encounter for screening for malignant neoplasm of colon: Secondary | ICD-10-CM

## 2014-07-01 DIAGNOSIS — N5201 Erectile dysfunction due to arterial insufficiency: Secondary | ICD-10-CM

## 2014-07-01 DIAGNOSIS — R6882 Decreased libido: Secondary | ICD-10-CM

## 2014-07-01 DIAGNOSIS — Z23 Encounter for immunization: Secondary | ICD-10-CM

## 2014-07-01 DIAGNOSIS — Z0000 Encounter for general adult medical examination without abnormal findings: Secondary | ICD-10-CM

## 2014-07-01 DIAGNOSIS — Z13220 Encounter for screening for lipoid disorders: Secondary | ICD-10-CM

## 2014-07-01 DIAGNOSIS — Z125 Encounter for screening for malignant neoplasm of prostate: Secondary | ICD-10-CM

## 2014-07-01 MED ORDER — SILDENAFIL CITRATE 20 MG PO TABS: ORAL_TABLET | ORAL | Status: DC

## 2014-07-01 NOTE — Progress Notes (Signed)
Dr. Frederico Hamman T. Rathana Viveros, MD, [facility] [place], [address] Phone: [phone] Fax: [phone]  07/01/2014  Patient: Nathan Santos, MRN: [id number], DOB: 02/28/1966, 49 y.o.  Primary Physician:  Owens Loffler, MD  Chief Complaint: Annual Exam  Subjective:   Nathan Santos is a 48 y.o. pleasant patient who presents with the following:  Preventative Health Maintenance Visit:  Health Maintenance Summary Reviewed and updated, unless pt declines services.  Tobacco History Reviewed. Alcohol: No concerns, no excessive use Exercise Habits: Some activity, rec at least 30 mins 5 times a week STD concerns: no risk or activity to increase risk Drug Use: None Encouraged self-testicular check  Health Maintenance  Topic Date Due  . COLONOSCOPY  04/06/2014  . INFLUENZA VACCINE  01/20/2015  . TETANUS/TDAP  11/19/2017   Immunization History  Administered Date(s) Administered  . Influenza,inj,Quad PF,36+ Mos 07/01/2014  . Td 11/20/2007   Fully better now. He recently had what sounds like he was having a kidney stone, and he thinks he passed these in size known and then some smaller stones later after this.  He subsequently completely got better.  Last ate around 11-11:30?  Check PSA and check LDL.  He has a number of questions regarding libido and low testosterone.  He is seen some advertisements on TV and wonders if he has low testosterone, and he thinks that the descriptions that he is seeing on TV and in magazines and other things that he has read fit the sensations that he is having.  In the past I have given him some Cialis, and this did seem to work quite a bit for him in terms of erectile performance. Decreased libido.  Interested in some Cialis.   ?? Test ??  Patient Active Problem List   Diagnosis Date Noted  . Erectile dysfunction due to arterial insufficiency 07/02/2014  . Right lower quadrant abdominal  pain 06/03/2014  . Right anterior knee pain,concerning for meniscal tear 12/07/2013  . Acute radicular low back pain 02/08/2013  . TOBACCO USE 04/24/2008  . HYPERTENSION 04/24/2008  . ALLERGIC RHINITIS 04/24/2008   Past Medical History  Diagnosis Date  . Allergic rhinitis   . Palpitations   . Hypertension   . Tobacco abuse    Past Surgical History  Procedure Laterality Date  . Laparoscopic cholecystectomy     History   Social History  . Marital Status: Married    Spouse Name: N/A    Number of Children: 1  . Years of Education: N/A   Occupational History  . [employer]    Social History Main Topics  . Smoking status: Current Some Day Smoker  . Smokeless tobacco: Never Used     Comment: smokes mainly on the weekends  . Alcohol Use: 0.0 oz/week    0 Not specified per week  . Drug Use: No  . Sexual Activity: Not on file   Other Topics Concern  . Not on file   Social History Narrative   REGULAR EXERCISE- YES   Family History  Problem Relation Age of Onset  . Heart disease Father   . Colon cancer Neg Hx    No Known Allergies  Medication list has been reviewed and updated.   General: Denies fever, chills, sweats. No significant weight loss. Eyes: Denies blurring,significant itching ENT: Denies earache, sore throat, and hoarseness. Cardiovascular: Denies chest pains, palpitations, dyspnea on exertion Respiratory: Denies cough, dyspnea at rest,wheeezing Breast: no  concerns about lumps GI: Denies nausea, vomiting, diarrhea, constipation, change in bowel habits, abdominal pain, melena, hematochezia GU: Denies penile discharge, AS ABOVE ED, urinary flow / outflow problems. No STD concerns. Musculoskeletal: Denies back pain, joint pain Derm: Denies rash, itching Neuro: Denies  paresthesias, frequent falls, frequent headaches Psych: Denies depression, anxiety Endocrine: Denies cold intolerance, heat intolerance, polydipsia Heme: Denies enlarged lymph  nodes Allergy: No hayfever  Objective:   BP 130/78 mmHg  Pulse 71  Temp(Src) 97.7 F (36.5 C) (Oral)  Ht 5' 9.5" (1.765 m)  Wt 257 lb 8 oz (116.801 kg)  BMI 37.49 kg/m2 Ideal Body Weight: Weight in (lb) to have BMI = 25: 171.4  No exam data present  GEN: well developed, well nourished, no acute distress Eyes: conjunctiva and lids normal, PERRLA, EOMI ENT: TM clear, nares clear, oral exam WNL Neck: supple, no lymphadenopathy, no thyromegaly, no JVD Pulm: clear to auscultation and percussion, respiratory effort normal CV: regular rate and rhythm, S1-S2, no murmur, rub or gallop, no bruits, peripheral pulses normal and symmetric, no cyanosis, clubbing, edema or varicosities GI: soft, non-tender; no hepatosplenomegaly, masses; active bowel sounds all quadrants GU: no hernia, testicular mass, penile discharge Lymph: no cervical, axillary or inguinal adenopathy MSK: gait normal, muscle tone and strength WNL, no joint swelling, effusions, discoloration, crepitus  SKIN: clear, good turgor, color WNL, no rashes, lesions, or ulcerations Neuro: normal mental status, normal strength, sensation, and motion Psych: alert; oriented to person, place and time, normally interactive and not anxious or depressed in appearance.  All labs reviewed with patient.  Lipids:    Component Value Date/Time   CHOL 192 09/08/2011 0802   TRIG 51.0 09/08/2011 0802   HDL 58.10 09/08/2011 0802   VLDL 10.2 09/08/2011 0802   CHOLHDL 3 09/08/2011 0802   CBC: CBC Latest Ref Rng 06/03/2014 09/08/2011 07/01/2008  WBC 4.0 - 10.5 K/uL 14.1(H) 10.1 5.8  Hemoglobin 13.0 - 17.0 g/dL 16.0 16.3 15.7  Hematocrit 39.0 - 52.0 % 48.6 48.5 44.7  Platelets 150.0 - 400.0 K/uL 229.0 193.0 025    Basic Metabolic Panel:    Component Value Date/Time   NA 137 06/03/2014 1625   K 4.4 06/03/2014 1625   CL 103 06/03/2014 1625   CO2 28 06/03/2014 1625   BUN 19 06/03/2014 1625   CREATININE 1.1 06/03/2014 1625   GLUCOSE 93  06/03/2014 1625   CALCIUM 9.5 06/03/2014 1625   Hepatic Function Latest Ref Rng 06/03/2014 09/08/2011 07/01/2008  Total Protein 6.0 - 8.3 g/dL 7.3 7.1 6.7  Albumin 3.5 - 5.2 g/dL 4.1 4.2 3.9  AST 0 - 37 U/L '14 19 18  ' ALT 0 - 53 U/L '18 23 22  ' Alk Phosphatase 39 - 117 U/L 77 70 51  Total Bilirubin 0.2 - 1.2 mg/dL 0.5 0.5 0.6  Bilirubin, Direct 0.0 - 0.3 mg/dL - 0.1 0.1    No results found for: TSH Lab Results  Component Value Date   PSA 1.56 09/08/2011   PSA 0.90 07/01/2008    Assessment and Plan:   Healthcare maintenance  Need for prophylactic vaccination and inoculation against influenza - Plan: Flu Vaccine QUAD 36+ mos IM  Special screening for malignant neoplasms, colon - Plan: Ambulatory referral to Gastroenterology  Low libido - Plan: Testosterone, free, total. >10 minutes spent in face to face time with patient, >50% spent in counselling or coordination of care: I reviewed with him the multifactorial nature of decreased libido with management age.  Also reviewed fatigue.  Also reviewed the diagnosis of hypogonadism requiring 2 fasting testosterone levels meeting diagnostic criteria.  Also reviewed risks of treatment including risk of prostate cancer and risk of higher stage prostate cancer.  For now, I gave him some generic sildenafil to use on a when necessary basis.  He will come in for testosterone testing on a fasting state.  Screening for prostate cancer - Plan: PSA  Screening, lipid - Plan: Lipid panel  Erectile dysfunction due to arterial insufficiency  Health Maintenance Exam: The patient's preventative maintenance and recommended screening tests for an annual wellness exam were reviewed in full today. Brought up to date unless services declined.  Counselled on the importance of diet, exercise, and its role in overall health and mortality. The patient's FH and SH was reviewed, including their home life, tobacco status, and drug and alcohol status.  Follow-up: No  Follow-up on file. Unless noted, follow-up in 1 year for Health Maintenance Exam.  New Prescriptions   SILDENAFIL (REVATIO) 20 MG TABLET    2-5 tablets 30 minutes before intercourse   Orders Placed This Encounter  Procedures  . Flu Vaccine QUAD 36+ mos IM  . Lipid panel  . PSA  . Testosterone, free, total  . Ambulatory referral to Gastroenterology    Signed,  Frederico Hamman T. Dominic Rhome, MD   Patient's Medications  New Prescriptions   SILDENAFIL (REVATIO) 20 MG TABLET    2-5 tablets 30 minutes before intercourse  Previous Medications   IBUPROFEN (ADVIL,MOTRIN) 200 MG TABLET    Take 200 mg by mouth every 6 (six) hours as needed.   MULTIPLE VITAMIN (MULTIVITAMIN) TABLET    Take 1 tablet by mouth daily.    Modified Medications   No medications on file  Discontinued Medications   DICLOFENAC (VOLTAREN) 75 MG EC TABLET    Take 1 tablet (75 mg total) by mouth 2 (two) times daily.   HYDROCODONE-ACETAMINOPHEN (NORCO/VICODIN) 5-325 MG PER TABLET    Take 1 tablet by mouth every 6 (six) hours as needed for moderate pain.   LORATADINE (CLARITIN) 10 MG TABLET    Take 10 mg by mouth daily.   NAPROXEN (NAPROSYN) 500 MG TABLET    Take one po bid x 1 week then prn pain, take with food   TAMSULOSIN (FLOMAX) 0.4 MG CAPS CAPSULE    Take 1 capsule (0.4 mg total) by mouth daily.

## 2014-07-01 NOTE — Patient Instructions (Addendum)
F/u fasting labs in the AM from 8-10 AM.  REFERRALS TO SPECIALISTS, SPECIAL TESTS (MRI, CT, ULTRASOUNDS)  [facility] or [facility] will help you. ASK CHECK-IN FOR HELP.  Imaging / Special Testing referrals sometimes can be done same day if EMERGENCY, but others can take 2 or 3 days to get an appointment. Starting in 2015, many of the new Medicare plans and Obamacare plans take much longer.   Specialist appointment times vary a great deal, based on their schedule / openings. -- Some specialists have very long wait times. (Example. Dermatology. Multiple months  for non-cancer)

## 2014-07-01 NOTE — Progress Notes (Signed)
Pre visit review using our clinic review tool, if applicable. No additional management support is needed unless otherwise documented below in the visit note. 

## 2014-07-02 DIAGNOSIS — N5201 Erectile dysfunction due to arterial insufficiency: Secondary | ICD-10-CM | POA: Insufficient documentation

## 2014-07-12 ENCOUNTER — Other Ambulatory Visit: Payer: Commercial Managed Care - PPO

## 2014-07-16 ENCOUNTER — Other Ambulatory Visit (INDEPENDENT_AMBULATORY_CARE_PROVIDER_SITE_OTHER): Payer: Commercial Managed Care - PPO

## 2014-07-16 ENCOUNTER — Encounter: Payer: Self-pay | Admitting: Family Medicine

## 2014-07-16 ENCOUNTER — Ambulatory Visit (INDEPENDENT_AMBULATORY_CARE_PROVIDER_SITE_OTHER): Payer: Commercial Managed Care - PPO | Admitting: Family Medicine

## 2014-07-16 VITALS — BP 142/94 | HR 67 | Temp 97.6°F | Wt 265.2 lb

## 2014-07-16 DIAGNOSIS — R6882 Decreased libido: Secondary | ICD-10-CM

## 2014-07-16 DIAGNOSIS — J069 Acute upper respiratory infection, unspecified: Secondary | ICD-10-CM

## 2014-07-16 DIAGNOSIS — Z125 Encounter for screening for malignant neoplasm of prostate: Secondary | ICD-10-CM

## 2014-07-16 DIAGNOSIS — Z13220 Encounter for screening for lipoid disorders: Secondary | ICD-10-CM

## 2014-07-16 LAB — LIPID PANEL
CHOLESTEROL: 173 mg/dL (ref 0–200)
HDL: 53.9 mg/dL (ref >39.00)
NonHDL: 119.1
Total CHOL/HDL Ratio: 3
Triglycerides: 311 mg/dL — ABNORMAL HIGH (ref 0.0–149.0)
VLDL: 62.2 mg/dL — ABNORMAL HIGH (ref 0.0–40.0)

## 2014-07-16 LAB — LDL CHOLESTEROL, DIRECT: Direct LDL: 100 mg/dL

## 2014-07-16 LAB — PSA: PSA: 1.09 ng/mL (ref 0.10–4.00)

## 2014-07-16 MED ORDER — FLUTICASONE PROPIONATE 50 MCG/ACT NA SUSP: 1.0000 | Freq: Every day | NASAL | Status: DC

## 2014-07-16 NOTE — Assessment & Plan Note (Signed)
Likely viral, nontoxic, supportive care. Can use flonase prn if needed.  Sinuses not ttp, well appear, call back if not better by the end of the week.  Out of work in the meantime.

## 2014-07-16 NOTE — Addendum Note (Signed)
Addended by: Tonia Ghent on: 07/16/2014 08:31 AM   Modules accepted: Orders, Medications

## 2014-07-16 NOTE — Patient Instructions (Signed)
Drink plenty of fluids, take ibuprofen as needed with food, and use nasal saline.  If still stuffy you can use warm compresses on your face.  OTC flonase (1-2 sprays per nostril per day) may help.  This should gradually improve.  Take care.  Let us know if you have other concerns.

## 2014-07-16 NOTE — Progress Notes (Signed)
Pre visit review using our clinic review tool, if applicable. No additional management support is needed unless otherwise documented below in the visit note.  Sx started about 2 days ago.  Some fevers, some blood tinged tinged rhinorrhea.  No vomiting, no diarrhea, no ear pain. Some aches.  Appetite is still good.  Some mild ST. No neck LA.  No facial pain.  Works at [employer].  Tried OTC cold meds, nyquil at night.   Tried an OTC decongestant and that may have affected his BP.  No cough now, had some initially Sunday, resolved now.  Smoking vapor/E cig now.    Meds, vitals, and allergies reviewed.   ROS: See HPI.  Otherwise, noncontributory.  GEN: nad, alert and oriented HEENT: mucous membranes moist, tm w/o erythema, nasal exam w/o erythema, clear discharge noted,  OP with cobblestoning, sinuses not ttp, no bleeding site seen NECK: supple w/o LA CV: rrr.   PULM: ctab, no inc wob EXT: no edema SKIN: no acute rash

## 2014-07-17 LAB — TESTOSTERONE, FREE, TOTAL, SHBG
Sex Hormone Binding: 36 nmol/L (ref 10–50)
TESTOSTERONE FREE: 52.3 pg/mL (ref 47.0–244.0)
TESTOSTERONE: 282 ng/dL — AB (ref 300–890)
Testosterone-% Free: 1.9 % (ref 1.6–2.9)

## 2014-07-23 ENCOUNTER — Ambulatory Visit (AMBULATORY_SURGERY_CENTER): Payer: Self-pay

## 2014-07-23 VITALS — Ht 71.0 in | Wt 262.0 lb

## 2014-07-23 DIAGNOSIS — Z1211 Encounter for screening for malignant neoplasm of colon: Secondary | ICD-10-CM

## 2014-07-23 MED ORDER — MOVIPREP 100 G PO SOLR: 1.0000 | Freq: Once | ORAL | Status: DC

## 2014-07-23 NOTE — Progress Notes (Signed)
No allergies to eggs or soy No past problems with anesthesia No home oxygen No diet/weight loss meds  No email

## 2014-08-09 ENCOUNTER — Ambulatory Visit (AMBULATORY_SURGERY_CENTER): Payer: Commercial Managed Care - PPO | Admitting: Gastroenterology

## 2014-08-09 ENCOUNTER — Encounter: Payer: Self-pay | Admitting: Gastroenterology

## 2014-08-09 VITALS — BP 150/65 | HR 66 | Temp 97.8°F | Resp 17 | Ht 71.0 in | Wt 262.0 lb

## 2014-08-09 DIAGNOSIS — D122 Benign neoplasm of ascending colon: Secondary | ICD-10-CM

## 2014-08-09 DIAGNOSIS — Z1211 Encounter for screening for malignant neoplasm of colon: Secondary | ICD-10-CM

## 2014-08-09 MED ORDER — SODIUM CHLORIDE 0.9 % IV SOLN: 500.0000 mL | INTRAVENOUS | Status: DC

## 2014-08-09 NOTE — Op Note (Signed)
[place]  [address]. [place], [address]   COLONOSCOPY PROCEDURE REPORT  PATIENT: Nathan, Santos  MR#: [id number] BIRTHDATE: 08-Jan-1966 , 74  yrs. old GENDER: male ENDOSCOPIST: Ladene Artist, MD, [facility] REFERRED PN:[id number] Celedonio Savage, M.D. PROCEDURE DATE:  08/09/2014 PROCEDURE:   Colonoscopy with biopsy First Screening Colonoscopy - Avg.  risk and is 50 yrs.  old or older Yes.  Prior Negative Screening - Now for repeat screening. N/A  History of Adenoma - Now for follow-up colonoscopy & has been > or = to 3 yrs.  N/A  Polyps Removed Today? Yes. ASA CLASS:   Class II INDICATIONS:average risk patient for colorectal cancer. MEDICATIONS: Monitored anesthesia care, Propofol 250 mg IV, and lidocaine 40 mg IV DESCRIPTION OF PROCEDURE:   After the risks benefits and alternatives of the procedure were thoroughly explained, informed consent was obtained.  The digital rectal exam revealed no abnormalities of the rectum.   The LB [id number]  endoscope was introduced through the anus and advanced to the cecum, which was identified by both the appendix and ileocecal valve. No adverse events experienced.   The quality of the prep was excellent, using MoviPrep  The instrument was then slowly withdrawn as the colon was fully examined.    COLON FINDINGS: A sessile polyp measuring 5 mm in size was found in the ascending colon.  A polypectomy was performed with cold forceps.  The resection was complete, the polyp tissue was completely retrieved and sent to histology.   There was mild diverticulosis noted in the sigmoid colon.   The examination was otherwise normal.  Retroflexed views revealed internal Grade I hemorrhoids. The time to cecum=1 minutes 19 seconds.  Withdrawal time=11 minutes 12 seconds.  The scope was withdrawn and the procedure completed. COMPLICATIONS: There were no immediate complications.  ENDOSCOPIC IMPRESSION: 1.   Sessile polyp in the  ascending colon; polypectomy performed with cold forceps 2.   Mild diverticulosis in the sigmoid colon 3.   Grade l internal hemorrhoids  RECOMMENDATIONS: 1.  Await pathology results 2.  Repeat colonoscopy in 5 years if polyp adenomatous; otherwise 10 years 3.  High fiber diet with liberal fluid intake.  eSigned:  Ladene Artist, MD, [facility] 08/09/2014 11:07 AM

## 2014-08-09 NOTE — Progress Notes (Signed)
Stable to RR 

## 2014-08-09 NOTE — Progress Notes (Signed)
Called to room to assist during endoscopic procedure.  Patient ID and intended procedure confirmed with present staff. Received instructions for my participation in the procedure from the performing physician.  

## 2014-08-09 NOTE — Patient Instructions (Signed)

## 2014-08-12 ENCOUNTER — Telehealth: Payer: Self-pay | Admitting: *Deleted

## 2014-08-12 NOTE — Telephone Encounter (Signed)
Name identifier, left message, follow-up 

## 2014-08-13 ENCOUNTER — Encounter: Payer: Self-pay | Admitting: Gastroenterology

## 2014-10-12 NOTE — Discharge Summary (Signed)
PATIENT NAME:  Nathan Santos, Nathan Santos MR#:  [id number] DATE OF BIRTH:  1965-12-24  DATE OF ADMISSION:  07/26/2013 DATE OF DISCHARGE:  07/27/2013  ADMITTING PHYSICIAN: Dr. Abel Presto  DISCHARGING PHYSICIAN:  Dr. Gladstone Lighter   PRIMARY CARE PHYSICIAN:  Dr. Lorelei Pont at [facility].   DISCHARGE DIAGNOSES: Musculoskeletal chest pain.  DISCHARGE MEDICATIONS: Tramadol 50 mg p.o. q. 6 hours p.r.n.   DISCHARGE DIET:  Regular diet.   DISCHARGE ACTIVITY: As tolerated.  FOLLOWUP INSTRUCTIONS:  Follow up with PCP in 2 to 3 weeks if chest pain recurs.  LABORATORIES AND IMAGING STUDIES PRIOR TO DISCHARGE: Troponins x 3 negative. Chest x-ray on admission revealing linear scar or atelectasis at right lung base. No acute cardiopulmonary disease.   WBC 7.3, hemoglobin 16.2, hematocrit 47.8, platelet count 234.  Sodium 137, potassium 4.4, chloride 105, bicarbonate 24, BUN 15, creatinine 0.81, glucose 94 and calcium of 9.2.  Exercise Myoview revealing no significant ischemia. No wall motion abnormality, overall low risk scan. Ejection fraction is estimated to be 66%, normal LV global function, no EKG changes and no artifact noted on this study.  BRIEF HOSPITAL COURSE:  Nathan Santos is a 49 year old pleasant Caucasian male with past medical history significant for past history of smoking, currently none, and family history of heart disease comes to the hospital with chest pain. The patient does lift heavy weights, too.  Chest pain, initially thought to be angina because of the typical nature of the pain and also with his underlying risk factors, so he was admitted overnight. Troponins were negative. Had a Myoview scan which was completely normal. Post analysis also seems like it could be musculoskeletal pain and the patient lifts heavy weights and his pain was bilateral chest and radiating to the shoulders associated with some soreness and weakness of the chest muscles. He is being discharged on tramadol.  He is otherwise stable. His course has been otherwise uneventful in the hospital.   DISCHARGE CONDITION: Stable.   DISCHARGE DISPOSITION: Home.  TIME SPENT ON DISCHARGE:  40 minutes.    ____________________________ Gladstone Lighter, MD rk:ce D: 07/27/2013 15:17:59 ET T: 07/27/2013 17:47:08 ET JOB#: [id number]  cc: Gladstone Lighter, MD, <Dictator> Owens Loffler, MD Gladstone Lighter MD ELECTRONICALLY SIGNED 08/07/2013 14:59

## 2014-10-12 NOTE — H&P (Signed)
PATIENT NAME:  Nathan Santos, Nathan Santos MR#:  [id number] DATE OF BIRTH:  01-17-1966  DATE OF ADMISSION:  07/26/2013  PRIMARY CARE PHYSICIAN: Dr. Ruben Reason at [facility].   CHIEF COMPLAINT: Chest pain.   HISTORY OF PRESENT ILLNESS: This is a 49 year old male who presents to the hospital with chest pain progressively getting worse over the past week or so. The patient said that he developed some pain around the center of his chest nonradiating about a week or so ago. 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 Although in the past few days, his pain has gotten significantly worse. This morning he actually developed significant chest heaviness, felt like a brick was sitting on his chest. He also became nauseous, became diaphoretic and also had shortness of breath. He therefore was a bit concerned and came to the ER for further evaluation. The patient presently still complains of some chest heaviness but it has improved since he has received some aspirin and nitro while here in the ER. Hospitalist services were contacted for further treatment and evaluation.   REVIEW OF SYSTEMS:  CONSTITUTIONAL: No documented fever. No weight gain. No weight loss.  EYES: No blurred or double vision.  ENT: No tinnitus. No postnasal drip. No redness of the oropharynx.  RESPIRATORY: No cough. No wheeze. No hemoptysis. No dyspnea.  CARDIOVASCULAR: Positive chest pain. No orthopnea. No palpitations, syncope.  GASTROINTESTINAL:  Positive nausea. No vomiting. No diarrhea. No abdominal pain. No melena or hematochezia.  GENITOURINARY: No dysuria, hematuria.  ENDOCRINE: No polyuria, nocturia, heat or cold intolerance.  HEMATOLOGIC: No anemia. No bruising. No bleeding.  INTEGUMENTARY: No rashes. No lesions.  MUSCULOSKELETAL: No arthritis. No swelling. No gout.  NEUROLOGIC: No numbness or tingling. No ataxia. No seizure-type activity.  PSYCHIATRIC: No  anxiety. No insomnia. No ADD.  PAST MEDICAL HISTORY:  The patient has no significant past medical history.   ALLERGIES: No known drug allergies.   SOCIAL HISTORY: Does have 20 to 30 pack-year smoking history. Currently is not smoking. No alcohol abuse. No illicit drug abuse. Lives at home by himself.   FAMILY HISTORY: The patient's mother is alive and healthy. Father died from complications of congestive heart failure.   CURRENT MEDICATIONS: The patient is currently on no medications.   PHYSICAL EXAMINATION: Presently is as follows:  VITAL SIGNS: Temperature 97.6, pulse 59, respirations 18, blood pressure 128/79, sats 96% on room air.  GENERAL: The patient is a pleasant-appearing male in no apparent distress.  HEAD, EYES, EARS, NOSE AND THROAT:  Atraumatic, normocephalic. Extraocular muscles are intact. Pupils are equal and reactive to light. Sclerae anicteric. No conjunctival injection. No pharyngeal erythema.  NECK: Supple. There is no jugular venous distention. No bruits, lymphadenopathy. No thyromegaly.  HEART: Regular rate and rhythm. No murmurs. No rubs. No clicks. LUNGS:  Clear to auscultation bilaterally. No rales or rhonchi. No wheezes.  ABDOMEN: Soft, flat, nontender, nondistended. Has good bowel sounds. No hepatosplenomegaly appreciated.  EXTREMITIES: No evidence of any cyanosis, clubbing or peripheral edema. Has +2 pedal and radial pulses bilaterally.  NEUROLOGICAL: The patient is alert, awake, oriented x 3 with no focal motor or sensory deficits appreciated bilaterally.  SKIN: Moist and warm with no rashes appreciated.  LYMPHATIC: There is no cervical or axillary lymphadenopathy.   LABORATORY DATA: Showed a serum glucose of 94, BUN 15, creatinine 0.8. Sodium 137, potassium 4.4, chloride 105, bicarb 24. Troponin less than 0.02. White cell  count 7.3, hemoglobin 16.1, hematocrit 47.8, platelet count 234.  EKG showed normal sinus rhythm with normal axis and no evidence of acute ST  or T wave changes.   ASSESSMENT AND PLAN: This is a 49 year old male with no significant past medical history presents to the hospital with chest pain progressively getting worse over the past week.   PROBLEM:  Chest pain. The patient's symptoms are very worrisome and typical for underlying angina. He does have risk factors given his history of tobacco abuse and family history. For now, I will observe him overnight on telemetry, follow serial cardiac markers, get a Myoview in the morning, give him aspirin nitroglycerin, check a lipid profile in the morning. I will hold off giving him beta blocker at this time as his heart rate is in the 50s for now. I discussed the case with Dr. Fletcher Anon.   CODE STATUS: The patient is a full code.  TIME SPENT WITH ADMISSION:  40 minutes    ____________________________ Belia Heman. Verdell Carmine, MD vjs:ce D: 07/26/2013 16:36:30 ET T: 07/26/2013 16:47:32 ET JOB#: [id number]  cc: Belia Heman. Verdell Carmine, MD, <Dictator> Henreitta Leber MD ELECTRONICALLY SIGNED 08/04/2013 17:54

## 2015-05-22 ENCOUNTER — Encounter: Payer: Self-pay | Admitting: Family Medicine

## 2015-05-22 ENCOUNTER — Ambulatory Visit (INDEPENDENT_AMBULATORY_CARE_PROVIDER_SITE_OTHER): Payer: Commercial Managed Care - PPO | Admitting: Family Medicine

## 2015-05-22 VITALS — BP 149/80 | HR 63 | Temp 98.3°F | Ht 69.5 in | Wt 271.5 lb

## 2015-05-22 DIAGNOSIS — J309 Allergic rhinitis, unspecified: Secondary | ICD-10-CM

## 2015-05-22 DIAGNOSIS — I10 Essential (primary) hypertension: Secondary | ICD-10-CM | POA: Diagnosis not present

## 2015-05-22 DIAGNOSIS — H1013 Acute atopic conjunctivitis, bilateral: Secondary | ICD-10-CM | POA: Diagnosis not present

## 2015-05-22 DIAGNOSIS — R202 Paresthesia of skin: Secondary | ICD-10-CM

## 2015-05-22 MED ORDER — AZELASTINE HCL 0.05 % OP SOLN: 1.0000 [drp] | Freq: Two times a day (BID) | OPHTHALMIC | Status: DC

## 2015-05-22 MED ORDER — LOSARTAN POTASSIUM-HCTZ 50-12.5 MG PO TABS: 1.0000 | ORAL_TABLET | Freq: Every day | ORAL | Status: DC

## 2015-05-22 NOTE — Assessment & Plan Note (Signed)
No clear back issues or peripheral nerve compression. Eval with labs to start evaluation. TSH, electrolytes, B12.

## 2015-05-22 NOTE — Assessment & Plan Note (Signed)
Has had dx before of borderline BP. Eval for secondary cause, end organ damage with labs.  Encouraged exercise, weight loss, healthy eating habits. Quit smoking. Start losartan HCTZ daily. Follow Bps at home, follow up in 2 weeks, check repeat BMET at that time for renal eval on ARB. Likely causing his dizziness and headaches.

## 2015-05-22 NOTE — Progress Notes (Signed)
Pre visit review using our clinic review tool, if applicable. No additional management support is needed unless otherwise documented below in the visit note. 

## 2015-05-22 NOTE — Progress Notes (Signed)
   Subjective:    Patient ID: Nathan Santos, male    DOB: 06/28/1965, 49 y.o.   MRN: [id number]  HPI  49 year old male presents for BP check.  He has several other concerns today as well.  Hypertension:   Has diagnosis .Marland Kitchen He is not currently on any medicaiton. BP Readings from Last 3 Encounters:  05/22/15 149/80  08/09/14 150/65  07/16/14 142/94  Using medication without problems or lightheadedness: None Chest pain with exertion:None Edema:None Short of breath:None Average home BPs: see below. Other issues:  He has not been working on lifestyle changes. He is currently smoking.  He has had a lot of stress lately. Son in hospital with brain injury.  BP in hospital 160/102.  Having throbbing headaches. Fingertips feel funny.  Tingling in right 2 and 3rd toes. As he walks on toes later in the day.. Start throbbing pain.  Mild congestion, PND, eyes itchy and blood shot in last month. Stopped wearing contacts.  Sneezing some. No discharge, mildly watery. Scratchy.  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  USes daily claritin.      Review of Systems  Constitutional: Positive for fatigue. Negative for fever.  HENT: Negative for ear pain.   Eyes: Positive for redness and itching. Negative for photophobia, pain and visual disturbance.  Respiratory: Negative for cough and shortness of breath.   Cardiovascular: Negative for chest pain.       Objective:   Physical Exam  Constitutional: Vital signs are normal. He appears well-developed and well-nourished.  HENT:  Head: Normocephalic.  Right Ear: Hearing normal.  Left Ear: Hearing normal.  Nose: Mucosal edema present. No rhinorrhea. Right sinus exhibits no maxillary sinus tenderness and no frontal sinus tenderness. Left sinus exhibits no maxillary sinus tenderness and no frontal sinus tenderness.  Mouth/Throat: Oropharynx is clear and moist and mucous membranes are normal.  Eyes: EOM and lids are normal. Pupils  are equal, round, and reactive to light. Right eye exhibits no discharge and no exudate. Left eye exhibits no discharge and no exudate. Right conjunctiva is injected. Right conjunctiva has no hemorrhage. Left conjunctiva is injected. Left conjunctiva has no hemorrhage.  Fundoscopic exam:      The right eye shows no arteriolar narrowing and no papilledema.       The left eye shows no arteriolar narrowing and no papilledema.  Neck: Trachea normal. Carotid bruit is not present. No thyroid mass and no thyromegaly present.  Cardiovascular: Normal rate, regular rhythm and normal pulses.  Exam reveals no gallop, no distant heart sounds and no friction rub.   No murmur heard. Pulses:      Dorsalis pedis pulses are 2+ on the right side, and 2+ on the left side.       Posterior tibial pulses are 2+ on the right side, and 2+ on the left side.  No peripheral edema  Pulmonary/Chest: Effort normal and breath sounds normal. No respiratory distress.  Neurological: He has normal strength. No cranial nerve deficit. Gait normal.  Numbness in right 2nd and 3rd toes  Skin: Skin is warm, dry and intact. No rash noted.  Psychiatric: He has a normal mood and affect. His speech is normal and behavior is normal. Thought content normal.          Assessment & Plan:

## 2015-05-22 NOTE — Patient Instructions (Addendum)
Stop smoking.  Get back back on track with healthy eating, weight loss and exercise.  Get BP cuff at home. Check BP daily . Start HCTZ losartan daily. Return for BP follow up in 2 weeks.  Stop at lab on way out.

## 2015-05-23 ENCOUNTER — Encounter: Payer: Self-pay | Admitting: *Deleted

## 2015-05-23 LAB — CBC WITH DIFFERENTIAL/PLATELET
Basophils Absolute: 0 10*3/uL (ref 0.0–0.1)
Basophils Relative: 0.4 % (ref 0.0–3.0)
EOS PCT: 1 % (ref 0.0–5.0)
Eosinophils Absolute: 0.1 10*3/uL (ref 0.0–0.7)
HCT: 45.9 % (ref 39.0–52.0)
Hemoglobin: 15.5 g/dL (ref 13.0–17.0)
LYMPHS ABS: 3.1 10*3/uL (ref 0.7–4.0)
Lymphocytes Relative: 31.6 % (ref 12.0–46.0)
MCHC: 33.8 g/dL (ref 30.0–36.0)
MCV: 96.2 fl (ref 78.0–100.0)
MONO ABS: 0.5 10*3/uL (ref 0.1–1.0)
MONOS PCT: 5.2 % (ref 3.0–12.0)
NEUTROS ABS: 6.1 10*3/uL (ref 1.4–7.7)
NEUTROS PCT: 61.8 % (ref 43.0–77.0)
Platelets: 233 10*3/uL (ref 150.0–400.0)
RBC: 4.78 Mil/uL (ref 4.22–5.81)
RDW: 13.3 % (ref 11.5–15.5)
WBC: 9.9 10*3/uL (ref 4.0–10.5)

## 2015-05-23 LAB — COMPREHENSIVE METABOLIC PANEL
ALK PHOS: 76 U/L (ref 39–117)
ALT: 24 U/L (ref 0–53)
AST: 17 U/L (ref 0–37)
Albumin: 4.1 g/dL (ref 3.5–5.2)
BUN: 16 mg/dL (ref 6–23)
CO2: 25 meq/L (ref 19–32)
Calcium: 9.4 mg/dL (ref 8.4–10.5)
Chloride: 104 mEq/L (ref 96–112)
Creatinine, Ser: 0.94 mg/dL (ref 0.40–1.50)
GFR: 89.88 mL/min (ref >60.00)
GLUCOSE: 86 mg/dL (ref 70–99)
Potassium: 4.5 mEq/L (ref 3.5–5.1)
Sodium: 139 mEq/L (ref 135–145)
TOTAL PROTEIN: 7.3 g/dL (ref 6.0–8.3)
Total Bilirubin: 0.4 mg/dL (ref 0.2–1.2)

## 2015-05-23 LAB — TSH: TSH: 1.2 u[IU]/mL (ref 0.35–4.50)

## 2015-05-23 LAB — VITAMIN B12: Vitamin B-12: 182 pg/mL — ABNORMAL LOW (ref 211–911)

## 2015-05-27 ENCOUNTER — Telehealth: Payer: Self-pay | Admitting: Family Medicine

## 2015-05-27 MED ORDER — NEOMYCIN-POLYMYXIN-DEXAMETH 0.1 % OP SUSP: 1.0000 [drp] | Freq: Four times a day (QID) | OPHTHALMIC | Status: DC

## 2015-05-27 NOTE — Telephone Encounter (Signed)
Pt called stating the eye drops your prescribed is not helping and he wanted to know if you could call something in stronger  [place]

## 2015-05-27 NOTE — Telephone Encounter (Signed)
Sent in antibiotic ointment given conjunctivitis not improving with allergy treatment. Also has steroid in it that can help with redness and symptoms.  If pt has glaucoma.. Cancel order and let me know. Have pt call if not improving as expected.

## 2015-05-27 NOTE — Telephone Encounter (Signed)
Left message for Nathan Santos to return my call.

## 2015-05-27 NOTE — Telephone Encounter (Signed)
Patient returned Donna's call. I let him know Dr.Bedsole's comments.  He said he doesn't have Glaucoma and [facility] to tell him [facility] rx was ready.

## 2015-06-06 ENCOUNTER — Encounter: Payer: Self-pay | Admitting: Family Medicine

## 2015-06-06 ENCOUNTER — Ambulatory Visit (INDEPENDENT_AMBULATORY_CARE_PROVIDER_SITE_OTHER): Payer: Commercial Managed Care - PPO | Admitting: Family Medicine

## 2015-06-06 VITALS — BP 132/70 | HR 73 | Temp 97.4°F | Ht 69.5 in | Wt 268.8 lb

## 2015-06-06 DIAGNOSIS — R202 Paresthesia of skin: Secondary | ICD-10-CM | POA: Diagnosis not present

## 2015-06-06 DIAGNOSIS — I10 Essential (primary) hypertension: Secondary | ICD-10-CM | POA: Insufficient documentation

## 2015-06-06 DIAGNOSIS — E538 Deficiency of other specified B group vitamins: Secondary | ICD-10-CM | POA: Insufficient documentation

## 2015-06-06 LAB — BASIC METABOLIC PANEL
BUN: 13 mg/dL (ref 7–25)
CALCIUM: 9.7 mg/dL (ref 8.6–10.3)
CO2: 28 mmol/L (ref 20–31)
CREATININE: 0.94 mg/dL (ref 0.70–1.33)
Chloride: 99 mmol/L (ref 98–110)
Glucose, Bld: 108 mg/dL — ABNORMAL HIGH (ref 65–99)
Potassium: 4.1 mmol/L (ref 3.5–5.3)
SODIUM: 138 mmol/L (ref 135–146)

## 2015-06-06 MED ORDER — LOSARTAN POTASSIUM-HCTZ 50-12.5 MG PO TABS: 1.0000 | ORAL_TABLET | Freq: Every day | ORAL | Status: DC

## 2015-06-06 MED ORDER — NEOMYCIN-POLYMYXIN-DEXAMETH 0.1 % OP SUSP: 1.0000 [drp] | Freq: Four times a day (QID) | OPHTHALMIC | Status: DC

## 2015-06-06 NOTE — Assessment & Plan Note (Signed)
Resolved with B12

## 2015-06-06 NOTE — Progress Notes (Signed)
Pre visit review using our clinic review tool, if applicable. No additional management support is needed unless otherwise documented below in the visit note. 

## 2015-06-06 NOTE — Progress Notes (Signed)
   Subjective:    Patient ID: Nathan Santos, male    DOB: 1965-09-15, 49 y.o.   MRN: [id number]  HPI  49 year old male with HTN presents for 2 week BP follow up.   Hypertension:  At goal now on losartan HCTZ. Due for cr recheck. Dizziness improved with BP lowering. He has noted pounding heart beat lasting few minutes after eating. BP Readings from Last 3 Encounters:  06/06/15 132/70  05/22/15 149/80  08/09/14 150/65  Using medication without problems or lightheadedness:  None Chest pain with exertion:None Edema:None Short of breath: None Average home BPs: not checking. Other issues:  Tingling in feet:  Improved. TSH and electrolytes. Low B12.. Started 1000 mg daily in last week.  Eye irritaion improved after antibiotic steroid drops... Wants some more on hand if symptoms return.  Review of Systems  Constitutional: Negative for fever and fatigue.  HENT: Negative for ear pain.   Eyes: Negative for pain.  Respiratory: Negative for shortness of breath.   Cardiovascular: Negative for leg swelling.       Objective:   Physical Exam  Constitutional: Vital signs are normal. He appears well-developed and well-nourished.  overweight  HENT:  Head: Normocephalic.  Right Ear: Hearing normal.  Left Ear: Hearing normal.  Nose: Nose normal.  Mouth/Throat: Oropharynx is clear and moist and mucous membranes are normal.  Eyes: EOM are normal. Pupils are equal, round, and reactive to light. Right eye exhibits no exudate. Left eye exhibits no exudate. Right conjunctiva is injected. Left conjunctiva is injected.  Neck: Trachea normal. Carotid bruit is not present. No thyroid mass and no thyromegaly present.  Cardiovascular: Normal rate, regular rhythm and normal pulses.  Exam reveals no gallop, no distant heart sounds and no friction rub.   No murmur heard. No peripheral edema  Pulmonary/Chest: Effort normal and breath sounds normal. No respiratory distress.  Skin: Skin is warm, dry and  intact. No rash noted.  Psychiatric: He has a normal mood and affect. His speech is normal and behavior is normal. Thought content normal.          Assessment & Plan:

## 2015-06-06 NOTE — Assessment & Plan Note (Signed)
Improved control and tolerable SE to med. Check Cr for stability.  Encouraged exercise, weight loss, healthy eating habits.  Low salt diet.

## 2015-06-06 NOTE — Assessment & Plan Note (Signed)
Improved with antibiotics steroid drop. Pt told to not use steroid more than 5 days at a time. Discussed risk of glaucoma.

## 2015-06-06 NOTE — Patient Instructions (Addendum)
Stop at lab on way out. Continue B12 supplement. Continue new BP med.  Work on low salt diet, exercise and weight loss.

## 2015-06-10 ENCOUNTER — Telehealth: Payer: Self-pay | Admitting: Family Medicine

## 2015-06-10 NOTE — Telephone Encounter (Signed)
Left message for Nathan Santos that his kidney function is normal on the new BP medication.

## 2015-06-10 NOTE — Telephone Encounter (Signed)
Pt returned your call.  

## 2016-01-14 ENCOUNTER — Other Ambulatory Visit: Payer: Self-pay | Admitting: Family Medicine

## 2016-01-14 NOTE — Telephone Encounter (Signed)
Last office visit 06/06/2015 with Dr. Diona Browner.  Last CPE 07/01/2014.  Refill?

## 2016-07-10 DIAGNOSIS — J019 Acute sinusitis, unspecified: Secondary | ICD-10-CM | POA: Diagnosis not present

## 2016-11-12 ENCOUNTER — Encounter: Payer: Self-pay | Admitting: Family Medicine

## 2016-11-12 ENCOUNTER — Ambulatory Visit (INDEPENDENT_AMBULATORY_CARE_PROVIDER_SITE_OTHER): Payer: Commercial Managed Care - PPO | Admitting: Family Medicine

## 2016-11-12 ENCOUNTER — Ambulatory Visit (INDEPENDENT_AMBULATORY_CARE_PROVIDER_SITE_OTHER)
Admission: RE | Admit: 2016-11-12 | Discharge: 2016-11-12 | Disposition: A | Discharge status: Home / Self Care | Payer: Commercial Managed Care - PPO | Source: Ambulatory Visit | Attending: Family Medicine | Admitting: Family Medicine

## 2016-11-12 VITALS — BP 148/96 | HR 75 | Temp 97.6°F | Wt 281.5 lb

## 2016-11-12 DIAGNOSIS — R109 Unspecified abdominal pain: Secondary | ICD-10-CM | POA: Diagnosis not present

## 2016-11-12 LAB — POC URINALSYSI DIPSTICK (AUTOMATED)
Bilirubin, UA: NEGATIVE
Glucose, UA: NEGATIVE
KETONES UA: NEGATIVE
Leukocytes, UA: NEGATIVE
Nitrite, UA: NEGATIVE
PH UA: 6 (ref 5.0–8.0)
PROTEIN UA: NEGATIVE
Urobilinogen, UA: 0.2 E.U./dL

## 2016-11-12 MED ORDER — OXYCODONE-ACETAMINOPHEN 5-325 MG PO TABS: 1.0000 | ORAL_TABLET | Freq: Four times a day (QID) | ORAL | 0 refills | Status: DC | PRN

## 2016-11-12 MED ORDER — LOSARTAN POTASSIUM-HCTZ 50-12.5 MG PO TABS: 1.0000 | ORAL_TABLET | Freq: Every day | ORAL | 0 refills | Status: DC

## 2016-11-12 MED ORDER — TAMSULOSIN HCL 0.4 MG PO CAPS: 0.4000 mg | ORAL_CAPSULE | Freq: Every day | ORAL | 0 refills | Status: DC

## 2016-11-12 NOTE — Progress Notes (Signed)
Off BP medicine in the meantime. Has been off for about 1 month.  No CP, SOB, BLE edema.  Rx done at [facility] for temporary supply.  D/w pt about f/u with PCP.   New issue- started about 2 weeks ago.  Had sig L sided lower back pain. Then it radiated/moved into his L flank and abd.  He thinks he passed a stone.  He had lower urine stream/flow then it quickly got better.  He had resolution of sx.   Was back to baseline.    Then in the last few days he had the same sx return.  L back then flank pain.  Some nausea.  No diarrhea. No blood in urine.  Stream isn't as strong as normal.    Meds, vitals, and allergies reviewed.   ROS: Per HPI unless specifically indicated in ROS section   nad but uncomfortable.   ncat Mmm OP wnl Neck supple,no LA rrr ctab abd soft, not ttp No cva pain L flank ttp, w/o rebound Normal BS Ext w/o edema.

## 2016-11-12 NOTE — Patient Instructions (Addendum)
Start flomax and use the pain medicine as needed (sedation caution, you may need a stool softener).  Drink plenty of fluids.  Strain your urine and let us know if you catch a stone.   If you have a fever the go to the ER.  Take care.  Glad to see you.  Update [place] early next week if not better.

## 2016-11-14 DIAGNOSIS — R109 Unspecified abdominal pain: Secondary | ICD-10-CM | POA: Insufficient documentation

## 2016-11-14 NOTE — Assessment & Plan Note (Signed)
Presumed stone, but not seen on imaging.  D/w pt, imaging reviewed.  Start flomax, use opiates as needed with routine cautions, drink plenty of fluids.  Strain urine.  We can do stone analysis if he catches a stone.  Update [place] as needed.  ER cautions given.  >25 minutes spent in face to face time with patient, >50% spent in counselling or coordination of care. See AVS.

## 2017-01-01 ENCOUNTER — Other Ambulatory Visit: Payer: Self-pay | Admitting: Family Medicine

## 2017-01-16 ENCOUNTER — Other Ambulatory Visit: Payer: Self-pay | Admitting: Family Medicine

## 2017-05-18 ENCOUNTER — Other Ambulatory Visit: Payer: Self-pay | Admitting: Family Medicine

## 2017-06-13 ENCOUNTER — Other Ambulatory Visit: Payer: Self-pay | Admitting: *Deleted

## 2017-06-13 MED ORDER — LOSARTAN POTASSIUM-HCTZ 50-12.5 MG PO TABS: 1.0000 | ORAL_TABLET | Freq: Every day | ORAL | 0 refills | Status: DC

## 2017-06-17 ENCOUNTER — Other Ambulatory Visit: Payer: Self-pay | Admitting: Family Medicine

## 2017-06-17 ENCOUNTER — Telehealth: Payer: Self-pay | Admitting: Family Medicine

## 2017-06-17 MED ORDER — LOSARTAN POTASSIUM-HCTZ 50-12.5 MG PO TABS: 1.0000 | ORAL_TABLET | Freq: Every day | ORAL | 0 refills | Status: DC

## 2017-06-17 NOTE — Telephone Encounter (Signed)
Copied from [place] [id number]. Topic: Quick Communication - See Telephone Encounter >> Jun 17, 2017  9:39 AM Bea Graff, NT wrote: CRM for notification. See Telephone encounter for: [facility] [address] [facility] that the refill of Losartan was denied by the office. I do see in the history that a refill was sent on 06/13/17 but stated that would be his last refill until he has a physical. Pt scheduled for a physical on 06/23/17. Please call pharmacy to clarify med refill.   06/17/17.

## 2017-06-17 NOTE — Telephone Encounter (Signed)
Losartan-HCTZ refilled for #15.

## 2017-06-20 ENCOUNTER — Other Ambulatory Visit (INDEPENDENT_AMBULATORY_CARE_PROVIDER_SITE_OTHER): Payer: Commercial Managed Care - PPO

## 2017-06-20 DIAGNOSIS — E785 Hyperlipidemia, unspecified: Secondary | ICD-10-CM | POA: Diagnosis not present

## 2017-06-20 DIAGNOSIS — E538 Deficiency of other specified B group vitamins: Secondary | ICD-10-CM

## 2017-06-20 DIAGNOSIS — I10 Essential (primary) hypertension: Secondary | ICD-10-CM

## 2017-06-20 DIAGNOSIS — F17200 Nicotine dependence, unspecified, uncomplicated: Secondary | ICD-10-CM

## 2017-06-20 DIAGNOSIS — Z125 Encounter for screening for malignant neoplasm of prostate: Secondary | ICD-10-CM

## 2017-06-20 LAB — CBC WITH DIFFERENTIAL/PLATELET
BASOS ABS: 0 10*3/uL (ref 0.0–0.1)
Basophils Relative: 0.4 % (ref 0.0–3.0)
EOS ABS: 0.2 10*3/uL (ref 0.0–0.7)
Eosinophils Relative: 2.4 % (ref 0.0–5.0)
HEMATOCRIT: 47.4 % (ref 39.0–52.0)
Hemoglobin: 15.8 g/dL (ref 13.0–17.0)
LYMPHS PCT: 33.6 % (ref 12.0–46.0)
Lymphs Abs: 2.5 10*3/uL (ref 0.7–4.0)
MCHC: 33.3 g/dL (ref 30.0–36.0)
MCV: 99.1 fl (ref 78.0–100.0)
MONOS PCT: 6.1 % (ref 3.0–12.0)
Monocytes Absolute: 0.5 10*3/uL (ref 0.1–1.0)
NEUTROS ABS: 4.3 10*3/uL (ref 1.4–7.7)
Neutrophils Relative %: 57.5 % (ref 43.0–77.0)
PLATELETS: 245 10*3/uL (ref 150.0–400.0)
RBC: 4.78 Mil/uL (ref 4.22–5.81)
RDW: 13.5 % (ref 11.5–15.5)
WBC: 7.5 10*3/uL (ref 4.0–10.5)

## 2017-06-20 LAB — BASIC METABOLIC PANEL
BUN: 20 mg/dL (ref 6–23)
CHLORIDE: 102 meq/L (ref 96–112)
CO2: 29 meq/L (ref 19–32)
Calcium: 9.1 mg/dL (ref 8.4–10.5)
Creatinine, Ser: 0.9 mg/dL (ref 0.40–1.50)
GFR: 93.75 mL/min (ref >60.00)
Glucose, Bld: 99 mg/dL (ref 70–99)
POTASSIUM: 4.7 meq/L (ref 3.5–5.1)
Sodium: 140 mEq/L (ref 135–145)

## 2017-06-20 LAB — LIPID PANEL
CHOL/HDL RATIO: 3
CHOLESTEROL: 177 mg/dL (ref 0–200)
HDL: 51.2 mg/dL (ref >39.00)
LDL CALC: 86 mg/dL (ref 0–99)
NonHDL: 125.98
TRIGLYCERIDES: 200 mg/dL — AB (ref 0.0–149.0)
VLDL: 40 mg/dL (ref 0.0–40.0)

## 2017-06-20 LAB — HEPATIC FUNCTION PANEL
ALBUMIN: 4.4 g/dL (ref 3.5–5.2)
ALT: 30 U/L (ref 0–53)
AST: 15 U/L (ref 0–37)
Alkaline Phosphatase: 76 U/L (ref 39–117)
BILIRUBIN DIRECT: 0.1 mg/dL (ref 0.0–0.3)
TOTAL PROTEIN: 6.9 g/dL (ref 6.0–8.3)
Total Bilirubin: 0.5 mg/dL (ref 0.2–1.2)

## 2017-06-20 LAB — VITAMIN B12: VITAMIN B 12: 209 pg/mL — AB (ref 211–911)

## 2017-06-20 LAB — PSA: PSA: 1.19 ng/mL (ref 0.10–4.00)

## 2017-06-23 ENCOUNTER — Ambulatory Visit (INDEPENDENT_AMBULATORY_CARE_PROVIDER_SITE_OTHER): Payer: Commercial Managed Care - PPO | Admitting: Family Medicine

## 2017-06-23 ENCOUNTER — Encounter: Payer: Self-pay | Admitting: Family Medicine

## 2017-06-23 ENCOUNTER — Other Ambulatory Visit: Payer: Self-pay

## 2017-06-23 VITALS — BP 140/80 | HR 85 | Temp 97.6°F | Ht 70.0 in | Wt 291.5 lb

## 2017-06-23 DIAGNOSIS — Z0000 Encounter for general adult medical examination without abnormal findings: Secondary | ICD-10-CM

## 2017-06-23 MED ORDER — VARENICLINE TARTRATE 1 MG PO TABS: 1.0000 mg | ORAL_TABLET | Freq: Two times a day (BID) | ORAL | 3 refills | Status: DC

## 2017-06-23 MED ORDER — LOSARTAN POTASSIUM-HCTZ 50-12.5 MG PO TABS: 1.0000 | ORAL_TABLET | Freq: Every day | ORAL | 3 refills | Status: DC

## 2017-06-23 MED ORDER — VARENICLINE TARTRATE 0.5 MG X 11 & 1 MG X 42 PO MISC: ORAL | 0 refills | Status: DC

## 2017-06-23 NOTE — Progress Notes (Signed)
Dr. Frederico Hamman T. Cherrie Franca, MD, [facility] [place], [address] Phone: [phone] Fax: [phone]  06/23/2017  Patient: Nathan Santos, MRN: [id number], DOB: 1965-06-23, 52 y.o.  Primary Physician:  Owens Loffler, MD   Chief Complaint  Patient presents with  . Annual Exam   Subjective:   Nathan Santos is a 52 y.o. pleasant patient who presents with the following:  Preventative Health Maintenance Visit:  Health Maintenance Summary Reviewed and updated, unless pt declines services.  30 pound weight gain in 3 years  Tobacco History Reviewed. Alcohol: No concerns, no excessive use Exercise Habits: Some activity, rec at least 30 mins 5 times a week STD concerns: no risk or activity to increase risk Drug Use: None Encouraged self-testicular check  Health Maintenance  Topic Date Due  . Hepatitis C Screening  Oct 09, 1965  . HIV Screening  04/07/1979  . INFLUENZA VACCINE  09/18/2017 (Originally 01/19/2017)  . TETANUS/TDAP  11/19/2017  . COLONOSCOPY  08/10/2019   Immunization History  Administered Date(s) Administered  . Influenza,inj,Quad PF,6+ Mos 07/01/2014  . Td 11/20/2007   Patient Active Problem List   Diagnosis Date Noted  . Left flank pain 11/14/2016  . B12 deficiency 06/06/2015  . Essential hypertension 06/06/2015  . Paresthesia of right foot 05/22/2015  . Essential hypertension, benign 05/22/2015  . Allergic conjunctivitis 05/22/2015  . Erectile dysfunction due to arterial insufficiency 07/02/2014  . TOBACCO USE 04/24/2008  . ALLERGIC RHINITIS 04/24/2008   Past Medical History:  Diagnosis Date  . Allergic rhinitis   . Hypertension   . Palpitations   . Tobacco abuse    Past Surgical History:  Procedure Laterality Date  . LAPAROSCOPIC CHOLECYSTECTOMY     Social History   Socioeconomic History  . Marital status: Married    Spouse name: Not on file  . Number of children: 1  . Years of  education: Not on file  . Highest education level: Not on file  Social Needs  . Financial resource strain: Not on file  . Food insecurity - worry: Not on file  . Food insecurity - inability: Not on file  . Transportation needs - medical: Not on file  . Transportation needs - non-medical: Not on file  Occupational History  . Occupation: PLASTICS RECYCLING  Tobacco Use  . Smoking status: Current Every Day Smoker    Last attempt to quit: 05/21/2014    Years since quitting: 3.0  . Smokeless tobacco: Current User  . Tobacco comment: smokes mainly on the weekends, VAPOR  Substance and Sexual Activity  . Alcohol use: Yes    Alcohol/week: 0.0 oz    Comment: occ 3 times a month  . Drug use: No  . Sexual activity: Not on file  Other Topics Concern  . Not on file  Social History Narrative   REGULAR EXERCISE- YES   Family History  Problem Relation Age of Onset  . Heart disease Father   . Colon cancer Neg Hx    No Known Allergies  Medication list has been reviewed and updated.   General: Denies fever, chills, sweats. No significant weight loss. Eyes: Denies blurring,significant itching ENT: Denies earache, sore throat, and hoarseness. Cardiovascular: Denies chest pains, palpitations, dyspnea on exertion Respiratory: Denies cough, dyspnea at rest,wheeezing Breast: no concerns about lumps GI: Denies nausea, vomiting, diarrhea, constipation, change in bowel habits, abdominal pain, melena, hematochezia GU: Denies penile discharge, ED, urinary flow / outflow problems. No STD concerns.  Musculoskeletal: Denies back pain, joint pain Derm: Denies rash, itching Neuro: Denies  paresthesias, frequent falls, frequent headaches Psych: Denies depression, anxiety Endocrine: Denies cold intolerance, heat intolerance, polydipsia Heme: Denies enlarged lymph nodes Allergy: No hayfever  Objective:   BP 140/80   Pulse 85   Temp 97.6 F (36.4 C) (Oral)   Ht '5\' 10"'  (1.778 m)   Wt 291 lb 8 oz  (132.2 kg)   BMI 41.83 kg/m  Ideal Body Weight: Weight in (lb) to have BMI = 25: 173.9  No exam data present  GEN: well developed, well nourished, no acute distress Eyes: conjunctiva and lids normal, PERRLA, EOMI ENT: TM clear, nares clear, oral exam WNL Neck: supple, no lymphadenopathy, no thyromegaly, no JVD Pulm: clear to auscultation and percussion, respiratory effort normal CV: regular rate and rhythm, S1-S2, no murmur, rub or gallop, no bruits, peripheral pulses normal and symmetric, no cyanosis, clubbing, edema or varicosities GI: soft, non-tender; no hepatosplenomegaly, masses; active bowel sounds all quadrants GU: no hernia, testicular mass, penile discharge Lymph: no cervical, axillary or inguinal adenopathy MSK: gait normal, muscle tone and strength WNL, no joint swelling, effusions, discoloration, crepitus  SKIN: clear, good turgor, color WNL, no rashes, lesions, or ulcerations Neuro: normal mental status, normal strength, sensation, and motion Psych: alert; oriented to person, place and time, normally interactive and not anxious or depressed in appearance. All labs reviewed with patient.  Lipids:    Component Value Date/Time   CHOL 177 06/20/2017 0958   CHOL 192 07/27/2013 0506   TRIG 200.0 (H) 06/20/2017 0958   TRIG 142 07/27/2013 0506   HDL 51.20 06/20/2017 0958   HDL 47 07/27/2013 0506   LDLDIRECT 100.0 07/16/2014 0806   VLDL 40.0 06/20/2017 0958   VLDL 28 07/27/2013 0506   CHOLHDL 3 06/20/2017 0958   CBC: CBC Latest Ref Rng & Units 06/20/2017 05/22/2015 06/03/2014  WBC 4.0 - 10.5 K/uL 7.5 9.9 14.1(H)  Hemoglobin 13.0 - 17.0 g/dL 15.8 15.5 16.0  Hematocrit 39.0 - 52.0 % 47.4 45.9 48.6  Platelets 150.0 - 400.0 K/uL 245.0 233.0 546.5    Basic Metabolic Panel:    Component Value Date/Time   NA 140 06/20/2017 0958   NA 137 07/26/2013 1049   K 4.7 06/20/2017 0958   K 4.4 07/26/2013 1049   CL 102 06/20/2017 0958   CL 105 07/26/2013 1049   CO2 29  06/20/2017 0958   CO2 24 07/26/2013 1049   BUN 20 06/20/2017 0958   BUN 15 07/26/2013 1049   CREATININE 0.90 06/20/2017 0958   CREATININE 0.94 06/06/2015 1527   GLUCOSE 99 06/20/2017 0958   GLUCOSE 94 07/26/2013 1049   CALCIUM 9.1 06/20/2017 0958   CALCIUM 9.2 07/26/2013 1049   Hepatic Function Latest Ref Rng & Units 06/20/2017 05/22/2015 06/03/2014  Total Protein 6.0 - 8.3 g/dL 6.9 7.3 7.3  Albumin 3.5 - 5.2 g/dL 4.4 4.1 4.1  AST 0 - 37 U/L '15 17 14  ' ALT 0 - 53 U/L '30 24 18  ' Alk Phosphatase 39 - 117 U/L 76 76 77  Total Bilirubin 0.2 - 1.2 mg/dL 0.5 0.4 0.5  Bilirubin, Direct 0.0 - 0.3 mg/dL 0.1 - -    Lab Results  Component Value Date   TSH 1.20 05/22/2015   Lab Results  Component Value Date   PSA 1.19 06/20/2017   PSA 1.09 07/16/2014   PSA 1.56 09/08/2011    Assessment and Plan:   Healthcare maintenance   's primary issues or  weight gain, being way overweight, and smoking.  I think he understands this, and he is going to try to work on these over the next year to get them under control.  Health Maintenance Exam: The patient's preventative maintenance and recommended screening tests for an annual wellness exam were reviewed in full today. Brought up to date unless services declined.  Counselled on the importance of diet, exercise, and its role in overall health and mortality. The patient's FH and SH was reviewed, including their home life, tobacco status, and drug and alcohol status.  Follow-up in 1 year for physical exam or additional follow-up below.  Follow-up: No Follow-up on file. Or follow-up in 1 year if not noted.  Meds ordered this encounter  Medications  . losartan-hydrochlorothiazide (HYZAAR) 50-12.5 MG tablet    Sig: Take 1 tablet by mouth daily.    Dispense:  90 tablet    Refill:  3  . varenicline (CHANTIX STARTING MONTH PAK) 0.5 MG X 11 & 1 MG X 42 tablet    Sig: Take one 0.43m tablet by mouth once daily for 3 days, then increase to one 0.537m tablet twice daily for 3 days, then increase to one 42m56mablet twice daily.    Dispense:  53 tablet    Refill:  0  . varenicline (CHANTIX) 1 MG tablet    Sig: Take 1 tablet (1 mg total) by mouth 2 (two) times daily.    Dispense:  60 tablet    Refill:  3   Medications Discontinued During This Encounter  Medication Reason  . loratadine (CLARITIN) 10 MG tablet Completed Course  . oxyCODONE-acetaminophen (ROXICET) 5-325 MG tablet Completed Course  . sildenafil (REVATIO) 20 MG tablet Completed Course  . tamsulosin (FLOMAX) 0.4 MG CAPS capsule Completed Course  . losartan-hydrochlorothiazide (HYZAAR) 50-12.5 MG tablet Reorder    Signed,  SpeFrederico Hamman Brick Ketcher, MD   Allergies as of 06/23/2017   No Known Allergies     Medication List        Accurate as of 06/23/17 11:59 PM. Always use your most recent med list.          losartan-hydrochlorothiazide 50-12.5 MG tablet Commonly known as:  HYZAAR Take 1 tablet by mouth daily.   multivitamin tablet Take 1 tablet by mouth daily.   varenicline 0.5 MG X 11 & 1 MG X 42 tablet Commonly known as:  CHANTIX STARTING MONTH PAK Take one 0.5mg45mblet by mouth once daily for 3 days, then increase to one 0.5mg 68mlet twice daily for 3 days, then increase to one 42mg t95met twice daily.   varenicline 1 MG tablet Commonly known as:  CHANTIX Take 1 tablet (1 mg total) by mouth 2 (two) times daily.

## 2017-10-10 ENCOUNTER — Other Ambulatory Visit: Payer: Self-pay | Admitting: *Deleted

## 2017-10-10 MED ORDER — HYDROCHLOROTHIAZIDE 12.5 MG PO CAPS: 12.5000 mg | ORAL_CAPSULE | Freq: Every day | ORAL | 1 refills | Status: DC

## 2017-10-10 MED ORDER — LOSARTAN POTASSIUM 50 MG PO TABS: 50.0000 mg | ORAL_TABLET | Freq: Every day | ORAL | 1 refills | Status: DC

## 2017-10-10 NOTE — Telephone Encounter (Signed)
[facility] prescriptions for Losartan and HCTZ.  Combination Losartan-HCTZ 50-12.5 mg is on backorder.  Separate prescriptions sent as requested.

## 2018-01-23 ENCOUNTER — Ambulatory Visit: Payer: Commercial Managed Care - PPO | Admitting: Family Medicine

## 2018-01-23 ENCOUNTER — Encounter: Payer: Self-pay | Admitting: Family Medicine

## 2018-01-23 VITALS — BP 110/72 | HR 68 | Temp 97.5°F | Ht 70.0 in | Wt 265.2 lb

## 2018-01-23 DIAGNOSIS — M545 Low back pain, unspecified: Secondary | ICD-10-CM

## 2018-01-23 MED ORDER — PREDNISONE 20 MG PO TABS: ORAL_TABLET | ORAL | 0 refills | Status: DC

## 2018-01-23 MED ORDER — TIZANIDINE HCL 4 MG PO TABS: 4.0000 mg | ORAL_TABLET | Freq: Every evening | ORAL | 1 refills | Status: AC

## 2018-01-23 NOTE — Progress Notes (Signed)
Dr. Frederico Hamman T. Bodin Gorka, MD, [facility] Primary [facility] and Sports Medicine [place], [address] Phone: [phone] Fax: [phone]  01/23/2018  Patient: Nathan Santos, MRN: [id number], DOB: 1965-11-10, 52 y.o.  Primary Physician:  Owens Loffler, MD   Chief Complaint  Patient presents with  . Back Pain    Hurt picking dog up on Friday   Subjective:   Nathan Santos is a 52 y.o. very pleasant male patient who presents with the following:  Over the past couple of weeks, has been walking a lot. Lifted his dog and then started to hurt a lot starting on Friday.  He is having pain primarily around L5-S1 bilaterally.  He is also having pain in his buttocks region.  This only incident that he can recall is that of lifting his dog, and he did not have any specific pain at that time.  He also does a fair bit of lifting at work.  He is having some pain going down his legs occasionally but not consistently, and is not have any numbness or tingling.  Not having any bowel or bladder incontinence or saddle anesthesia.  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   l > r leg  Past Medical History, Surgical History, Social History, Family History, Problem List, Medications, and Allergies have been reviewed and updated if relevant.  Patient Active Problem List   Diagnosis Date Noted  . B12 deficiency 06/06/2015  . Essential hypertension, benign 05/22/2015  . Allergic conjunctivitis 05/22/2015  . Erectile dysfunction due to arterial insufficiency 07/02/2014  . TOBACCO USE 04/24/2008  . ALLERGIC RHINITIS 04/24/2008    Past Medical History:  Diagnosis Date  . Allergic rhinitis   . Hypertension   . Palpitations   . Tobacco abuse     Past Surgical History:  Procedure Laterality Date  . LAPAROSCOPIC CHOLECYSTECTOMY      Social History   Socioeconomic History  . Marital status: Married    Spouse name: Not on file  . Number of children: 1  . Years of education: Not on file  .  Highest education level: Not on file  Occupational History  . Occupation: PLASTICS RECYCLING  Social Needs  . Financial resource strain: Not on file  . Food insecurity:    Worry: Not on file    Inability: Not on file  . Transportation needs:    Medical: Not on file    Non-medical: Not on file  Tobacco Use  . Smoking status: Former Smoker    Last attempt to quit: 05/21/2014    Years since quitting: 3.6  . Smokeless tobacco: Former Network engineer and Sexual Activity  . Alcohol use: Yes    Alcohol/week: 0.0 oz    Comment: occ 3 times a month  . Drug use: No  . Sexual activity: Not on file  Lifestyle  . Physical activity:    Days per week: Not on file    Minutes per session: Not on file  . Stress: Not on file  Relationships  . Social connections:    Talks on phone: Not on file    Gets together: Not on file    Attends religious service: Not on file    Active member of club or organization: Not on file    Attends meetings of clubs or organizations: Not on file    Relationship status: Not on file  . Intimate partner violence:    Fear of current or ex partner: Not on file  Emotionally abused: Not on file    Physically abused: Not on file    Forced sexual activity: Not on file  Other Topics Concern  . Not on file  Social History Narrative   REGULAR EXERCISE- YES    Family History  Problem Relation Age of Onset  . Heart disease Father   . Colon cancer Neg Hx     No Known Allergies  Medication list reviewed and updated in full in [facility].  GEN: No fevers, chills. Nontoxic. Primarily MSK c/o today. MSK: Detailed in the HPI GI: tolerating PO intake without difficulty Neuro: No numbness, parasthesias, or tingling associated. Otherwise the pertinent positives of the ROS are noted above.   Objective:   BP 110/72   Pulse 68   Temp (!) 97.5 F (36.4 C) (Oral)   Ht 5\' 10"  (1.778 m)   Wt 265 lb 4 oz (120.3 kg)   BMI 38.06 kg/m    GEN:  Well-developed,well-nourished,in no acute distress; alert,appropriate and cooperative throughout examination HEENT: Normocephalic and atraumatic without obvious abnormalities. Ears, externally no deformities PULM: Breathing comfortably in no respiratory distress EXT: No clubbing, cyanosis, or edema PSYCH: Normally interactive. Cooperative during the interview. Pleasant. Friendly and conversant. Not anxious or depressed appearing. Normal, full affect.  Range of motion at  the waist: Flexion, extension, lateral bending and rotation: He has a significant limitation in his ability to extend, and he flexes to about 45 degrees.  He also has pain with lateral bending and rotation, but he has more normal movement patterns with these.  No echymosis or edema Rises to examination table with mild difficulty Gait: minimally antalgic  Inspection/Deformity: N Paraspinus Tenderness: Tender from L5-S1 bilaterally.  B Ankle Dorsiflexion (L5,4): 5/5 B Great Toe Dorsiflexion (L5,4): 5/5 Heel Walk (L5): WNL Toe Walk (S1): WNL Rise/Squat (L4): WNL, mild pain  SENSORY B Medial Foot (L4): WNL B Dorsum (L5): WNL B Lateral (S1): WNL Light Touch: WNL Pinprick: WNL  REFLEXES Knee (L4): 2+ Ankle (S1): 2+  B SLR, seated: neg B SLR, supine: back pain only B FABER: neg B Reverse FABER: neg B Greater Troch: NT B Log Roll: neg B Stork: NT B Sciatic Notch: NT   Radiology: No results found.  Assessment and Plan:   Acute bilateral low back pain without sciatica  I reviewed with the patient a handout from [facility] regarding their condition and a set of home exercises.    Classic MSK back pain, ROM, steroids, heat, massage  Follow-up: No follow-ups on file.  Meds ordered this encounter  Medications  . predniSONE (DELTASONE) 20 MG tablet    Sig: 2 tabs po for 5 days, then 1 tab po for 5 days    Dispense:  15 tablet    Refill:  0  . tiZANidine (ZANAFLEX) 4 MG tablet     Sig: Take 1 tablet (4 mg total) by mouth Nightly.    Dispense:  30 tablet    Refill:  1   Signed,  Mattingly Fountaine T. Tamila Gaulin, MD   Allergies as of 01/23/2018   No Known Allergies     Medication List        Accurate as of 01/23/18 11:59 PM. Always use your most recent med list.          hydrochlorothiazide 12.5 MG capsule Commonly known as:  MICROZIDE Take 1 capsule (12.5 mg total) by mouth daily.   losartan 50 MG tablet Commonly known as:  COZAAR Take  1 tablet (50 mg total) by mouth daily.   multivitamin tablet Take 1 tablet by mouth daily.   predniSONE 20 MG tablet Commonly known as:  DELTASONE 2 tabs po for 5 days, then 1 tab po for 5 days   tiZANidine 4 MG tablet Commonly known as:  ZANAFLEX Take 1 tablet (4 mg total) by mouth Nightly.

## 2018-01-24 ENCOUNTER — Encounter: Payer: Self-pay | Admitting: Family Medicine

## 2018-04-08 ENCOUNTER — Other Ambulatory Visit: Payer: Self-pay | Admitting: Family Medicine

## 2018-08-10 ENCOUNTER — Telehealth: Payer: Self-pay | Admitting: Family Medicine

## 2018-08-10 ENCOUNTER — Other Ambulatory Visit: Payer: Self-pay | Admitting: Family Medicine

## 2018-08-10 NOTE — Telephone Encounter (Signed)
Left message asking pt to call office  Please schedule CPE with fasting labs prior with Dr. Lorelei Pont.   Thanks, Butch Penny

## 2018-08-14 NOTE — Telephone Encounter (Signed)
Labs 3/18 cpx 3/23

## 2018-08-30 ENCOUNTER — Telehealth: Payer: Self-pay

## 2018-08-30 NOTE — Telephone Encounter (Signed)
Losartan 25 mg, 2 po daily, #180, 1 ref  HCTZ 12.5 mg, 1 po daily, #90, 1 ref

## 2018-08-30 NOTE — Telephone Encounter (Signed)
Pt requesting new BP med sent to [facility] since they cannot get losartan 50 mg at this time. Pt is out of BP medication.Pt understood [facility] [address] to say they could not get HCTZ 12.5 mg at this time also. I spoke with Barnetta Chapel pharmacist at [facility] and the losartan 50 mg is not available but [facility] 12.5 [facility]. Please advise.

## 2018-08-31 MED ORDER — LOSARTAN POTASSIUM 25 MG PO TABS: 50.0000 mg | ORAL_TABLET | Freq: Every day | ORAL | 1 refills | Status: DC

## 2018-08-31 NOTE — Telephone Encounter (Signed)
Losartan 25 mg prescription to take 2 tablets by mouth daily sent to [facility] [address] in [place] as instructed by Dr. Lorelei Pont.  HCTZ prescription was sent on 08/10/2018 for #90.

## 2018-09-03 ENCOUNTER — Encounter: Payer: Self-pay | Admitting: Family Medicine

## 2018-09-04 ENCOUNTER — Telehealth: Payer: Self-pay | Admitting: Family Medicine

## 2018-09-04 ENCOUNTER — Other Ambulatory Visit: Payer: Self-pay | Admitting: Family Medicine

## 2018-09-04 DIAGNOSIS — Z0000 Encounter for general adult medical examination without abnormal findings: Secondary | ICD-10-CM

## 2018-09-04 DIAGNOSIS — Z114 Encounter for screening for human immunodeficiency virus [HIV]: Secondary | ICD-10-CM

## 2018-09-04 DIAGNOSIS — Z131 Encounter for screening for diabetes mellitus: Secondary | ICD-10-CM

## 2018-09-04 DIAGNOSIS — Z1159 Encounter for screening for other viral diseases: Secondary | ICD-10-CM

## 2018-09-04 MED ORDER — LOSARTAN POTASSIUM 50 MG PO TABS: 50.0000 mg | ORAL_TABLET | Freq: Every day | ORAL | 0 refills | Status: DC

## 2018-09-04 MED ORDER — HYDROCHLOROTHIAZIDE 12.5 MG PO CAPS: ORAL_CAPSULE | ORAL | 1 refills | Status: DC

## 2018-09-04 NOTE — Telephone Encounter (Signed)
Spoke with Mr. Nathan Santos.  He states [facility] spoken with [facility] today and they told him they do not have any prescriptions for him.  [facility]  He states they now have the 50 mg Losartan in stock and not the 25 mg.  New Rx for Losartan 50 mg sent to [facility] [address].  They do have the HCTZ 12.5 mg. They will get both these prescriptions ready for patient now.  Mr. Nathan Santos was instructed to check with his pharmacy later today.

## 2018-09-04 NOTE — Telephone Encounter (Signed)
Best number [phone] Pt called stating [facility] [place] Is out of losartan and pt wanted to know if you could call him something else in.   Pt is completely out of his meds nad has been for a week  Please advise when called in

## 2018-09-04 NOTE — Addendum Note (Signed)
Addended by: Carter Kitten on: 09/04/2018 09:38 AM   Modules accepted: Orders

## 2018-09-06 ENCOUNTER — Other Ambulatory Visit: Payer: Commercial Managed Care - PPO

## 2018-09-11 ENCOUNTER — Encounter: Payer: Commercial Managed Care - PPO | Admitting: Family Medicine

## 2018-10-19 ENCOUNTER — Other Ambulatory Visit: Payer: Commercial Managed Care - PPO

## 2018-10-26 ENCOUNTER — Encounter: Payer: Commercial Managed Care - PPO | Admitting: Family Medicine

## 2018-12-08 ENCOUNTER — Telehealth: Payer: Self-pay

## 2018-12-08 NOTE — Telephone Encounter (Signed)
Left message to call clinic, needs COVID screen and back door lab info  Also R/S lab appt to after 8am

## 2018-12-15 ENCOUNTER — Other Ambulatory Visit: Payer: Commercial Managed Care - PPO

## 2018-12-21 ENCOUNTER — Encounter: Payer: Commercial Managed Care - PPO | Admitting: Family Medicine

## 2019-02-08 ENCOUNTER — Other Ambulatory Visit: Payer: Commercial Managed Care - PPO

## 2019-02-14 ENCOUNTER — Encounter: Payer: Commercial Managed Care - PPO | Admitting: Family Medicine

## 2019-03-07 ENCOUNTER — Other Ambulatory Visit: Payer: Self-pay | Admitting: Family Medicine

## 2019-03-30 ENCOUNTER — Other Ambulatory Visit: Payer: Self-pay

## 2019-03-30 DIAGNOSIS — Z20822 Contact with and (suspected) exposure to covid-19: Secondary | ICD-10-CM

## 2019-03-31 LAB — NOVEL CORONAVIRUS, NAA: SARS-CoV-2, NAA: NOT DETECTED

## 2019-05-02 ENCOUNTER — Other Ambulatory Visit: Payer: Commercial Managed Care - PPO

## 2019-05-09 ENCOUNTER — Encounter: Payer: Commercial Managed Care - PPO | Admitting: Family Medicine

## 2019-06-01 ENCOUNTER — Other Ambulatory Visit: Payer: Self-pay

## 2019-06-01 DIAGNOSIS — Z20822 Contact with and (suspected) exposure to covid-19: Secondary | ICD-10-CM

## 2019-06-04 LAB — NOVEL CORONAVIRUS, NAA: SARS-CoV-2, NAA: NOT DETECTED

## 2019-06-17 ENCOUNTER — Other Ambulatory Visit: Payer: Self-pay | Admitting: Family Medicine

## 2019-07-23 ENCOUNTER — Other Ambulatory Visit: Payer: Self-pay | Admitting: Family Medicine

## 2019-07-26 ENCOUNTER — Other Ambulatory Visit: Payer: Commercial Managed Care - PPO

## 2019-08-02 ENCOUNTER — Encounter: Payer: Commercial Managed Care - PPO | Admitting: Family Medicine

## 2019-08-07 ENCOUNTER — Other Ambulatory Visit: Payer: Self-pay | Admitting: Family Medicine

## 2019-08-21 ENCOUNTER — Ambulatory Visit (INDEPENDENT_AMBULATORY_CARE_PROVIDER_SITE_OTHER): Payer: Commercial Managed Care - PPO | Admitting: Family Medicine

## 2019-08-21 ENCOUNTER — Encounter: Payer: Self-pay | Admitting: Family Medicine

## 2019-08-21 ENCOUNTER — Other Ambulatory Visit: Payer: Self-pay

## 2019-08-21 DIAGNOSIS — L259 Unspecified contact dermatitis, unspecified cause: Secondary | ICD-10-CM | POA: Insufficient documentation

## 2019-08-21 MED ORDER — TRIAMCINOLONE ACETONIDE 0.1 % EX CREA: 1.0000 "application " | TOPICAL_CREAM | Freq: Two times a day (BID) | CUTANEOUS | 0 refills | Status: DC

## 2019-08-21 NOTE — Patient Instructions (Addendum)
It looks like you have contact dermatitis   Try to avoid fragrances  Dove soap for sensitive skin is the best  Wear gloves for dish washing  Avoid very hot water   Use moisturizers with no color or fragrance (lubriderm, eucerin)   Try the triamcinolone cream twice daily to affected area   If no improvement in a week please let us know

## 2019-08-21 NOTE — Progress Notes (Signed)
Subjective:    Patient ID: Nathan Santos, male    DOB: 11/25/1965, 54 y.o.   MRN: [id number]  This visit occurred during the SARS-CoV-2 public health emergency.  Safety protocols were in place, including screening questions prior to the visit, additional usage of staff PPE, and extensive cleaning of exam room while observing appropriate contact time as indicated for disinfecting solutions.    HPI 54 yo pt of Dr Lorelei Pont presents with rash on L wrist /arm  It started 2 weeks ago  He does go out in the woods  Gets scraped up occasionally   Area is getting bigger  Itches - severe   He put calamine lotion on it   Had been using a dove body wash  Then switched to something else 2-3 wk ago       -- body wash (has a scent) but not sure what brand   He does wash dishes Uses hand sanitizer   Patient Active Problem List   Diagnosis Date Noted  . Contact dermatitis 08/21/2019  . B12 deficiency 06/06/2015  . Essential hypertension, benign 05/22/2015  . Allergic conjunctivitis and rhinitis, bilateral 05/22/2015  . Erectile dysfunction due to arterial insufficiency 07/02/2014  . TOBACCO USE 04/24/2008   Past Medical History:  Diagnosis Date  . Allergic rhinitis   . Hypertension   . Palpitations   . Tobacco abuse    Past Surgical History:  Procedure Laterality Date  . LAPAROSCOPIC CHOLECYSTECTOMY     Social History   Tobacco Use  . Smoking status: Former Smoker    Quit date: 05/21/2014    Years since quitting: 5.2  . Smokeless tobacco: Former Network engineer Use Topics  . Alcohol use: Yes    Alcohol/week: 0.0 standard drinks    Comment: occ 3 times a month  . Drug use: No   Family History  Problem Relation Age of Onset  . Heart disease Father   . Colon cancer Neg Hx    No Known Allergies Current Outpatient Medications on File Prior to Visit  Medication Sig Dispense Refill  . hydrochlorothiazide (MICROZIDE) 12.5 MG capsule TAKE 1 CAPSULE BY MOUTH EVERY DAY 90  capsule 0  . losartan (COZAAR) 50 MG tablet Take 1 tablet (50 mg total) by mouth daily. 90 tablet 0  . Multiple Vitamin (MULTIVITAMIN) tablet Take 1 tablet by mouth daily.       No current facility-administered medications on file prior to visit.    Review of Systems  Constitutional: Negative for activity change, appetite change, fatigue, fever and unexpected weight change.  HENT: Negative for congestion, rhinorrhea, sore throat and trouble swallowing.   Eyes: Negative for pain, redness, itching and visual disturbance.  Respiratory: Negative for cough, chest tightness, shortness of breath and wheezing.   Cardiovascular: Negative for chest pain and palpitations.  Gastrointestinal: Negative for abdominal pain, blood in stool, constipation, diarrhea and nausea.  Endocrine: Negative for cold intolerance, heat intolerance, polydipsia and polyuria.  Genitourinary: Negative for difficulty urinating, dysuria, frequency and urgency.  Musculoskeletal: Negative for arthralgias, joint swelling and myalgias.  Skin: Positive for rash. Negative for pallor.  Neurological: Negative for dizziness, tremors, weakness, numbness and headaches.  Hematological: Negative for adenopathy. Does not bruise/bleed easily.  Psychiatric/Behavioral: Negative for decreased concentration and dysphoric mood. The patient is not nervous/anxious.        Objective:   Physical Exam Constitutional:      General: He is not in acute distress.    Appearance: Normal appearance.  He is obese. He is not ill-appearing.  HENT:     Head: Normocephalic and atraumatic.     Mouth/Throat:     Mouth: Mucous membranes are moist.  Eyes:     General:        Right eye: No discharge.        Left eye: No discharge.     Conjunctiva/sclera: Conjunctivae normal.     Pupils: Pupils are equal, round, and reactive to light.  Cardiovascular:     Rate and Rhythm: Normal rate and regular rhythm.     Pulses: Normal pulses.  Pulmonary:     Effort:  Pulmonary effort is normal. No respiratory distress.     Breath sounds: Normal breath sounds. No wheezing or rales.  Musculoskeletal:        General: No swelling or tenderness.     Cervical back: Normal range of motion.     Comments: No acute joint changes  Lymphadenopathy:     Cervical: No cervical adenopathy.  Skin:    Findings: Rash present.     Comments: Right anterior forearm - diffuse dry skin with erythema and scale with a few excoriations  Resembles dermatitis/eczema  No vesicles   No other areas involved  Neurological:     Mental Status: He is alert.  Psychiatric:        Mood and Affect: Mood normal.           Assessment & Plan:   Problem List Items Addressed This Visit      Musculoskeletal and Integument   Contact dermatitis    Unsure of cause -  ? New body wash  Disc change to products w/o fragrance Avoid hot water Use moisturizers Triamcinolone 0.1% cream px to use bid Update if no improvement

## 2019-08-21 NOTE — Assessment & Plan Note (Signed)
Unsure of cause -  ? New body wash  Disc change to products w/o fragrance Avoid hot water Use moisturizers Triamcinolone 0.1% cream px to use bid Update if no improvement

## 2019-10-29 ENCOUNTER — Other Ambulatory Visit: Payer: Self-pay | Admitting: Family Medicine

## 2019-10-30 ENCOUNTER — Other Ambulatory Visit: Payer: Self-pay | Admitting: Family Medicine

## 2019-10-30 DIAGNOSIS — Z131 Encounter for screening for diabetes mellitus: Secondary | ICD-10-CM

## 2019-10-30 DIAGNOSIS — Z13220 Encounter for screening for lipoid disorders: Secondary | ICD-10-CM

## 2019-10-30 DIAGNOSIS — E538 Deficiency of other specified B group vitamins: Secondary | ICD-10-CM

## 2019-10-30 DIAGNOSIS — Z79899 Other long term (current) drug therapy: Secondary | ICD-10-CM

## 2019-10-30 DIAGNOSIS — Z125 Encounter for screening for malignant neoplasm of prostate: Secondary | ICD-10-CM

## 2019-11-02 ENCOUNTER — Other Ambulatory Visit: Payer: Commercial Managed Care - PPO

## 2019-11-07 ENCOUNTER — Encounter: Payer: Commercial Managed Care - PPO | Admitting: Family Medicine

## 2020-02-02 ENCOUNTER — Other Ambulatory Visit: Payer: Self-pay | Admitting: Family Medicine

## 2020-02-23 ENCOUNTER — Telehealth: Payer: Self-pay | Admitting: Family Medicine

## 2020-02-23 ENCOUNTER — Other Ambulatory Visit: Payer: Self-pay | Admitting: Family Medicine

## 2020-02-26 NOTE — Telephone Encounter (Signed)
Last office visit 08/21/2019 for rash with Dr. Glori Bickers.  Last seen by PCP 09/04/2018.  No future appointments.  Patient cancelled his appointment in 10/2019.  Refill?

## 2020-03-06 NOTE — Telephone Encounter (Signed)
Appointment 11/17

## 2020-05-06 ENCOUNTER — Encounter: Payer: Self-pay | Admitting: Family Medicine

## 2020-05-06 NOTE — Progress Notes (Signed)
Pesach Frisch T. Elain Wixon, MD, [facility]  Primary Care and [place] at [facility] [place], [address]  Phone: [phone]  FAX: [phone]  YADIR ZENTNER - 54 y.o. male  MRN [id number]  Date of Birth: 10-Dec-1965  Date: 05/07/2020  PCP: Owens Loffler, MD  Referral: Owens Loffler, MD  Chief Complaint  Patient presents with  . Annual Exam    This visit occurred during the SARS-CoV-2 public health emergency.  Safety protocols were in place, including screening questions prior to the visit, additional usage of staff PPE, and extensive cleaning of exam room while observing appropriate contact time as indicated for disinfecting solutions.   Patient Care Team: Owens Loffler, MD as PCP - General Subjective:   CORKY BLUMSTEIN is a 54 y.o. pleasant patient who presents with the following:  Preventative Health Maintenance Visit:  Health Maintenance Summary Reviewed and updated, unless pt declines services.  Tobacco History Reviewed.  Has picked it back up some with drinking Alcohol: once a week, maybe 2 Exercise Habits: Some activity, rec at least 30 mins 5 times a week STD concerns: no risk or activity to increase risk Drug Use: None  I have not seen him in greater than 2 years, and he is here for routine health maintenance.  Colonoscopy - needs COVID-19 status -he is still in the contemplative phase Influenza vaccine  R shoulder, went to [place], and about a week.  It was back like it was and it has gotten worse.  When lifting his shoulder, mild OA on XR at [facility].  Hurting a lot.    Health Maintenance  Topic Date Due  . Hepatitis C Screening  Never done  . COVID-19 Vaccine (1) Never done  . TETANUS/TDAP  11/19/2017  . COLONOSCOPY  08/10/2019  . INFLUENZA VACCINE  Completed  . HIV Screening  Completed   Immunization History  Administered Date(s) Administered  .  Influenza,inj,Quad PF,6+ Mos 07/01/2014  . Influenza-Unspecified 04/16/2020  . Td 11/20/2007   Patient Active Problem List   Diagnosis Date Noted  . B12 deficiency 06/06/2015  . Essential hypertension, benign 05/22/2015  . Allergic conjunctivitis and rhinitis, bilateral 05/22/2015  . Erectile dysfunction due to arterial insufficiency 07/02/2014  . TOBACCO USE 04/24/2008    Past Medical History:  Diagnosis Date  . Allergic rhinitis   . Hypertension   . Tobacco abuse     Past Surgical History:  Procedure Laterality Date  . LAPAROSCOPIC CHOLECYSTECTOMY      Family History  Problem Relation Age of Onset  . Heart disease Father   . Colon cancer Neg Hx     Past Medical History, Surgical History, Social History, Family History, Problem List, Medications, and Allergies have been reviewed and updated if relevant.  Review of Systems: Pertinent positives are listed above.  Otherwise, a full 14 point review of systems has been done in full and it is negative except where it is noted positive.  Objective:   BP 120/80   Pulse 71   Temp 98.1 F (36.7 C) (Temporal)   Ht 5' 9.75" (1.772 m)   Wt 276 lb 8 oz (125.4 kg)   SpO2 95%   BMI 39.96 kg/m  Ideal Body Weight: Weight in (lb) to have BMI = 25: 172.6  Ideal Body Weight: Weight in (lb) to have BMI = 25: 172.6 No exam data present Depression screen [facility] 2/9 05/07/2020 06/23/2017  Decreased Interest 0 0  Down,  Depressed, Hopeless 0 0  PHQ - 2 Score 0 0     GEN: well developed, well nourished, no acute distress Eyes: conjunctiva and lids normal, PERRLA, EOMI ENT: TM clear, nares clear, oral exam WNL Neck: supple, no lymphadenopathy, no thyromegaly, no JVD Pulm: clear to auscultation and percussion, respiratory effort normal CV: regular rate and rhythm, S1-S2, no murmur, rub or gallop, no bruits, peripheral pulses normal and symmetric, no cyanosis, clubbing, edema or varicosities GI: soft, non-tender; no hepatosplenomegaly,  masses; active bowel sounds all quadrants GU: deferred Lymph: no cervical, axillary or inguinal adenopathy  MSK: At the right shoulder the patient does have some decreased range of motion by about 25 degrees in the plane of flexion as well as 15 degrees and abduction compared to the contralateral side.  Internal and external rotation also lack about 20 degrees compared to the contralateral side.  Strength is 5/5 and neurovascularly intact.  SKIN: clear, good turgor, color WNL, no rashes, lesions, or ulcerations Neuro: normal mental status, normal strength, sensation, and motion Psych: alert; oriented to person, place and time, normally interactive and not anxious or depressed in appearance.  All labs reviewed with patient.  Assessment and Plan:     ICD-10-CM   1. Healthcare maintenance  Z00.00   2. Screen for colon cancer  Z12.11 Ambulatory referral to Gastroenterology  3. Screening PSA (prostate specific antigen)  Z12.5 PSA, Total with Reflex to PSA, Free  4. Screening for diabetes mellitus  A25.0 Basic metabolic panel    Hemoglobin A1c  5. Hyperlipidemia LDL goal <70  E78.5 Lipid panel  6. B12 deficiency  E53.8 Vitamin B12  7. Encounter for long-term (current) use of medications  Z79.899 CBC with Differential/Platelet    Hepatic function panel   Primary recommendations include discontinue smoking and weight loss.  Also recommended COVID-19 vaccination.  Right shoulder with some early frozen shoulder, likely secondary.  I gave him protocol from [facility], and these generally to do well, but if not addressed early on that this can become a protracted course.  Health Maintenance Exam: The patient's preventative maintenance and recommended screening tests for an annual wellness exam were reviewed in full today. Brought up to date unless services declined.  Counselled on the importance of diet, exercise, and its role in overall health and mortality. The patient's FH and SH was  reviewed, including their home life, tobacco status, and drug and alcohol status.  Follow-up in 1 year for physical exam or additional follow-up below.  Follow-up: No follow-ups on file. Or follow-up in 1 year if not noted.  No orders of the defined types were placed in this encounter.  Medications Discontinued During This Encounter  Medication Reason  . triamcinolone cream (KENALOG) 0.1 % Completed Course   Orders Placed This Encounter  Procedures  . Basic metabolic panel  . CBC with Differential/Platelet  . Hepatic function panel  . Hemoglobin A1c  . Lipid panel  . PSA, Total with Reflex to PSA, Free  . Vitamin B12  . Ambulatory referral to Gastroenterology    Signed,  Frederico Hamman T. Marisela Line, MD   Allergies as of 05/07/2020   No Known Allergies     Medication List       Accurate as of May 07, 2020  9:12 AM. If you have any questions, ask your nurse or doctor.        STOP taking these medications   triamcinolone 0.1 % Commonly known as: KENALOG Stopped by: Owens Loffler, MD  TAKE these medications   hydrochlorothiazide 12.5 MG capsule Commonly known as: MICROZIDE TAKE 1 CAPSULE BY MOUTH EVERY DAY   losartan 50 MG tablet Commonly known as: COZAAR TAKE 1 TABLET BY MOUTH EVERY DAY   multivitamin tablet Take 1 tablet by mouth daily.

## 2020-05-07 ENCOUNTER — Encounter: Payer: Self-pay | Admitting: Family Medicine

## 2020-05-07 ENCOUNTER — Other Ambulatory Visit: Payer: Self-pay

## 2020-05-07 ENCOUNTER — Ambulatory Visit (INDEPENDENT_AMBULATORY_CARE_PROVIDER_SITE_OTHER): Payer: Commercial Managed Care - PPO | Admitting: Family Medicine

## 2020-05-07 VITALS — BP 120/80 | HR 71 | Temp 98.1°F | Ht 69.75 in | Wt 276.5 lb

## 2020-05-07 DIAGNOSIS — E538 Deficiency of other specified B group vitamins: Secondary | ICD-10-CM | POA: Diagnosis not present

## 2020-05-07 DIAGNOSIS — Z125 Encounter for screening for malignant neoplasm of prostate: Secondary | ICD-10-CM

## 2020-05-07 DIAGNOSIS — Z1211 Encounter for screening for malignant neoplasm of colon: Secondary | ICD-10-CM | POA: Diagnosis not present

## 2020-05-07 DIAGNOSIS — Z0000 Encounter for general adult medical examination without abnormal findings: Secondary | ICD-10-CM

## 2020-05-07 DIAGNOSIS — E785 Hyperlipidemia, unspecified: Secondary | ICD-10-CM

## 2020-05-07 DIAGNOSIS — Z131 Encounter for screening for diabetes mellitus: Secondary | ICD-10-CM | POA: Diagnosis not present

## 2020-05-07 DIAGNOSIS — Z79899 Other long term (current) drug therapy: Secondary | ICD-10-CM

## 2020-05-07 LAB — BASIC METABOLIC PANEL
BUN: 15 mg/dL (ref 6–23)
CO2: 30 mEq/L (ref 19–32)
Calcium: 10 mg/dL (ref 8.4–10.5)
Chloride: 99 mEq/L (ref 96–112)
Creatinine, Ser: 0.88 mg/dL (ref 0.40–1.50)
GFR: 96.41 mL/min (ref >60.00)
Glucose, Bld: 107 mg/dL — ABNORMAL HIGH (ref 70–99)
Potassium: 4.8 mEq/L (ref 3.5–5.1)
Sodium: 137 mEq/L (ref 135–145)

## 2020-05-07 LAB — CBC WITH DIFFERENTIAL/PLATELET
Basophils Absolute: 0 10*3/uL (ref 0.0–0.1)
Basophils Relative: 0.4 % (ref 0.0–3.0)
Eosinophils Absolute: 0.1 10*3/uL (ref 0.0–0.7)
Eosinophils Relative: 1 % (ref 0.0–5.0)
HCT: 50.6 % (ref 39.0–52.0)
Hemoglobin: 17 g/dL (ref 13.0–17.0)
Lymphocytes Relative: 31.9 % (ref 12.0–46.0)
Lymphs Abs: 2.1 10*3/uL (ref 0.7–4.0)
MCHC: 33.6 g/dL (ref 30.0–36.0)
MCV: 95.3 fl (ref 78.0–100.0)
Monocytes Absolute: 0.5 10*3/uL (ref 0.1–1.0)
Monocytes Relative: 7.9 % (ref 3.0–12.0)
Neutro Abs: 3.9 10*3/uL (ref 1.4–7.7)
Neutrophils Relative %: 58.8 % (ref 43.0–77.0)
Platelets: 220 10*3/uL (ref 150.0–400.0)
RBC: 5.31 Mil/uL (ref 4.22–5.81)
RDW: 14 % (ref 11.5–15.5)
WBC: 6.7 10*3/uL (ref 4.0–10.5)

## 2020-05-07 LAB — LIPID PANEL
Cholesterol: 234 mg/dL — ABNORMAL HIGH (ref 0–200)
HDL: 67.2 mg/dL (ref >39.00)
LDL Cholesterol: 140 mg/dL — ABNORMAL HIGH (ref 0–99)
NonHDL: 167.12
Total CHOL/HDL Ratio: 3
Triglycerides: 135 mg/dL (ref 0.0–149.0)
VLDL: 27 mg/dL (ref 0.0–40.0)

## 2020-05-07 LAB — HEPATIC FUNCTION PANEL
ALT: 25 U/L (ref 0–53)
AST: 17 U/L (ref 0–37)
Albumin: 4.4 g/dL (ref 3.5–5.2)
Alkaline Phosphatase: 83 U/L (ref 39–117)
Bilirubin, Direct: 0.1 mg/dL (ref 0.0–0.3)
Total Bilirubin: 0.6 mg/dL (ref 0.2–1.2)
Total Protein: 7.3 g/dL (ref 6.0–8.3)

## 2020-05-07 LAB — VITAMIN B12: Vitamin B-12: 160 pg/mL — ABNORMAL LOW (ref 211–911)

## 2020-05-07 LAB — HEMOGLOBIN A1C: Hgb A1c MFr Bld: 5.6 % (ref 4.6–6.5)

## 2020-05-08 LAB — PSA, TOTAL WITH REFLEX TO PSA, FREE: PSA, Total: 1.2 ng/mL (ref <=4.0)

## 2020-05-19 ENCOUNTER — Other Ambulatory Visit: Payer: Self-pay | Admitting: Family Medicine

## 2020-08-20 ENCOUNTER — Telehealth: Payer: Self-pay

## 2020-08-20 NOTE — Telephone Encounter (Signed)
Spoke with patient who stated that if his pain gets worse he will be going to UC or ED.

## 2020-08-20 NOTE — Telephone Encounter (Signed)
[place] [facility] - Client TELEPHONE ADVICE RECORD [facility] Patient Name: Nathan Santos Gender: Male DOB: 12-16-1965 Age: 55 Y 66 M 13 D Return Phone Number: [phone] (Primary) Address: City/State/Zip: [place] [address] Client [facility] - Client Client Site [place] - Day Physician Copland, Frederico Hamman - MD Contact Type Call Who Is Calling Patient / Member / Family / Caregiver Call Type Triage / Clinical Relationship To Patient Self Return Phone Number [phone] (Primary) Chief Complaint Back Injury Reason for Call Symptomatic / Request for Nathan Santos states he hurt his back yesterday. He does have an appointment schedule for Friday. [place] Not Listed ED Translation No Nurse Assessment Nurse: Rodney Cruise, RN, Sean Date/Time (Eastern Time): 08/20/2020 10:39:44 AM Confirm and document reason for call. If symptomatic, describe symptoms. ---Caller states hurt back yesterday, is using a back brace and a heating pad. Happed yesterday afternoon, was bent over on a ladder and pulled something heavy and hurt back. Advil for pain. Does the patient have any new or worsening symptoms? ---Yes Will a triage be completed? ---Yes Related visit to physician within the last 2 weeks? ---No Does the PT have any chronic conditions? (i.e. diabetes, asthma, this includes High risk factors for pregnancy, etc.) ---Yes List chronic conditions. ---HTN Is this a behavioral health or substance abuse call? ---No Guidelines Guideline Title Affirmed Question Affirmed Notes Nurse Date/Time (Eastern Time) Back Injury Weakness of a leg or foot (e.g., unable to bear weight, dragging foot) Baxter, RN, Hilliard Clark 08/20/2020 10:42:41 AM Disp. Time Eilene Ghazi Time) Disposition Final User 08/20/2020 10:45:03 AM Go to ED Now (or PCP triage) Yes Baxter, RN, Lillia Dallas Disagree/Comply Comply Caller Understands  Yes PLEASE NOTE: All timestamps contained within this report are represented as [place] Standard Time. CONFIDENTIALTY NOTICE: This fax transmission is intended only for the addressee. It contains information that is legally privileged, confidential or otherwise protected from use or disclosure. If you are not the intended recipient, you are strictly prohibited from reviewing, disclosing, copying using or disseminating any of this information or taking any action in reliance on or regarding this information. If you have received this fax in error, please notify us immediately by telephone so that we can arrange for its return to [place]. Phone: [phone], Toll-Free: [phone], Fax: [phone] Page: 2 of 2 Call Id: [id number] PreDisposition InappropriateToAsk Care Advice Given Per Guideline GO TO ED NOW (OR PCP TRIAGE): CARE ADVICE given per Back Injury (Adult) guideline. Referrals GO TO FACILITY OTHER - SPECIFY

## 2020-08-20 NOTE — Telephone Encounter (Signed)
Called and LVM for patient to return call to office.  

## 2020-08-22 ENCOUNTER — Ambulatory Visit: Payer: Commercial Managed Care - PPO | Admitting: Primary Care

## 2020-08-22 ENCOUNTER — Encounter: Payer: Self-pay | Admitting: Primary Care

## 2020-08-22 ENCOUNTER — Other Ambulatory Visit: Payer: Self-pay

## 2020-08-22 VITALS — BP 132/74 | HR 65 | Temp 97.3°F | Ht 69.5 in | Wt 281.0 lb

## 2020-08-22 DIAGNOSIS — M5442 Lumbago with sciatica, left side: Secondary | ICD-10-CM | POA: Diagnosis not present

## 2020-08-22 MED ORDER — PREDNISONE 20 MG PO TABS: ORAL_TABLET | ORAL | 0 refills | Status: DC

## 2020-08-22 MED ORDER — CYCLOBENZAPRINE HCL 10 MG PO TABS: 10.0000 mg | ORAL_TABLET | Freq: Three times a day (TID) | ORAL | 0 refills | Status: DC | PRN

## 2020-08-22 NOTE — Patient Instructions (Signed)
Start prednisone 20 mg for back pain. Take 3 tablets by mouth once daily for 2 days, then 2 tablets for 3 days, then 1 tablet for 3 days.  You may take the cyclobenzaprine (Flexeril) 10 mg tablets every 8 hours for muscle spasms if needed.  Be careful as this may cause drowsiness.  Remember to stretch and walk as discussed.  Continue to use a heating pad if this helps..  It was a pleasure meeting you!

## 2020-08-22 NOTE — Assessment & Plan Note (Signed)
Suspect lumbar strain with muscle spasms and radiculopathy.  No alarm signs today.  Treat with prednisone taper 20 mg, Flexeril 10 mg as needed.  Discussed the importance of walking and stretching in order to increase range of motion and reduce spasms.  He will update next week if no improvement.

## 2020-08-22 NOTE — Progress Notes (Signed)
Subjective:    Patient ID: Nathan Santos, male    DOB: [birth year], 55 y.o.   MRN: [id number]  HPI  This visit occurred during the SARS-CoV-2 public health emergency.  Safety protocols were in place, including screening questions prior to the visit, additional usage of staff PPE, and extensive cleaning of exam room while observing appropriate contact time as indicated for disinfecting solutions.   [name] is a 55 year old male patient of Dr. Lorelei Pont with a history of hypertension, tobacco abuse, vitamin B12 deficiency who presents today with chief complaint of back pain.  His pain began about 4 days ago while bending forward grabbing a tool while working on a ladder.  His back pain is located to the left lower back with radiation into the anterior thigh down to his anterior patellar region, occasionally with radiation down to his toes.  He describes his pain as sharp and throbbing. Pain is worse with walking and movement, is constant and experiences with sitting but not as bad.   He denies bowel or bladder incontinence, numbness or tingling.  He has been taking ibuprofen and using a heating pad with temporary improvement. Today he's feeling worse.   Review of Systems  Musculoskeletal: Positive for arthralgias, back pain and myalgias.  Skin: Negative for color change.  Neurological: Negative for weakness and numbness.       Past Medical History:  Diagnosis Date  . Allergic rhinitis   . Hypertension   . Tobacco abuse      Social History   Socioeconomic History  . Marital status: Married    Spouse name: Not on file  . Number of children: 1  . Years of education: Not on file  . Highest education level: Not on file  Occupational History  . Occupation: PLASTICS RECYCLING  Tobacco Use  . Smoking status: Current Some Day Smoker    Last attempt to quit: 05/21/2014    Years since quitting: 6.2  . Smokeless tobacco: Former Network engineer and Sexual Activity  . Alcohol use:  Yes    Alcohol/week: 0.0 standard drinks    Comment: occ 3 times a month  . Drug use: No  . Sexual activity: Not on file  Other Topics Concern  . Not on file  Social History Narrative   REGULAR EXERCISE- YES   Social Determinants of Health   Financial Resource Strain: Not on file  Food Insecurity: Not on file  Transportation Needs: Not on file  Physical Activity: Not on file  Stress: Not on file  Social Connections: Not on file  Intimate Partner Violence: Not on file    Past Surgical History:  Procedure Laterality Date  . LAPAROSCOPIC CHOLECYSTECTOMY      Family History  Problem Relation Age of Onset  . Heart disease Father   . Colon cancer Neg Hx     No Known Allergies  Current Outpatient Medications on File Prior to Visit  Medication Sig Dispense Refill  . hydrochlorothiazide (MICROZIDE) 12.5 MG capsule TAKE 1 CAPSULE BY MOUTH EVERY DAY 90 capsule 3  . losartan (COZAAR) 50 MG tablet TAKE 1 TABLET BY MOUTH EVERY DAY 90 tablet 3  . Multiple Vitamin (MULTIVITAMIN) tablet Take 1 tablet by mouth daily.     No current facility-administered medications on file prior to visit.    BP 132/74   Pulse 65   Temp (!) 97.3 F (36.3 C) (Temporal)   Ht 5' 9.5" (1.765 m)   Wt 281 lb (127.5  kg)   SpO2 94%   BMI 40.90 kg/m    Objective:   Physical Exam Pulmonary:     Effort: Pulmonary effort is normal.  Musculoskeletal:     Lumbar back: Spasms and tenderness present. Decreased range of motion. Negative right straight leg raise test and negative left straight leg raise test.       Back:     Comments: Spasm noted to left lower back. 5 out of 5 strength to bilateral lower extremities.  Skin:    General: Skin is warm and dry.  Neurological:     Mental Status: He is alert.  Psychiatric:        Mood and Affect: Mood normal.            Assessment & Plan:

## 2020-08-27 ENCOUNTER — Other Ambulatory Visit: Payer: Self-pay | Admitting: Family Medicine

## 2020-08-27 ENCOUNTER — Encounter: Payer: Self-pay | Admitting: Family Medicine

## 2020-08-27 ENCOUNTER — Ambulatory Visit: Payer: Commercial Managed Care - PPO | Admitting: Family Medicine

## 2020-08-27 ENCOUNTER — Ambulatory Visit (INDEPENDENT_AMBULATORY_CARE_PROVIDER_SITE_OTHER)
Admission: RE | Admit: 2020-08-27 | Discharge: 2020-08-27 | Disposition: A | Discharge status: Home / Self Care | Payer: Commercial Managed Care - PPO | Source: Ambulatory Visit | Attending: Family Medicine | Admitting: Family Medicine

## 2020-08-27 ENCOUNTER — Other Ambulatory Visit: Payer: Self-pay

## 2020-08-27 VITALS — BP 142/90 | HR 90 | Temp 98.0°F | Ht 69.75 in | Wt 282.5 lb

## 2020-08-27 DIAGNOSIS — M5442 Lumbago with sciatica, left side: Secondary | ICD-10-CM

## 2020-08-27 MED ORDER — HYDROCODONE-ACETAMINOPHEN 5-325 MG PO TABS: 1.0000 | ORAL_TABLET | Freq: Four times a day (QID) | ORAL | 0 refills | Status: DC | PRN

## 2020-08-27 MED ORDER — HYDROCODONE-ACETAMINOPHEN 5-325 MG PO TABS
1.0000 | ORAL_TABLET | Freq: Four times a day (QID) | ORAL | 0 refills | Status: DC | PRN
Start: 2020-08-27 — End: 2021-03-27

## 2020-08-27 MED ORDER — PREDNISONE 20 MG PO TABS: ORAL_TABLET | ORAL | 0 refills | Status: DC

## 2020-08-27 NOTE — Progress Notes (Signed)
Nathan Santos T. Lakesha Levinson, MD, [facility] [place], [address]  Phone: [phone]  FAX: [phone]  Nathan Santos - 55 y.o. male  MRN [id number]  Date of Birth: [birth year]  Date: 08/27/2020  PCP: Nathan Loffler, MD  Referral: Nathan Loffler, MD  Chief Complaint  Patient presents with  . Back Pain    Not any better    This visit occurred during the SARS-CoV-2 public health emergency.  Safety protocols were in place, including screening questions prior to the visit, additional usage of staff PPE, and extensive cleaning of exam room while observing appropriate contact time as indicated for disinfecting solutions.   Subjective:   Nathan Santos is a 55 y.o. very pleasant male patient with Body mass index is 40.83 kg/m. who presents with the following:  Ongoing back pain off and on over time.    08/22/2020 OV with Nathan Santos.  Started last Tuesday.  Not sure when he hurt it.  Was on a ladder.  Pulling on a wrench.  Also was lifting his heavy dog.  He is not entirely sure when he could have hurt it.  He did not have any specific trauma or injury.  L side and buttocks.  Some tingling rarely in the toes.   hurts some now but will hurt really bad at other times.  Has not been to work since last Wed.  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  He does not think he could do the physical work or labor right now.  He denies bowel or bladder incontinence.  L SLR POs   Review of Systems is noted in the HPI, as appropriate   Objective:   BP (!) 142/90   Pulse 90   Temp 98 F (36.7 C) (Temporal)   Ht 5' 9.75" (1.772 m)   Wt 282 lb 8 oz (128.1 kg)   SpO2 97%   BMI 40.83 kg/m    Range of motion at  the waist: Flexion, extension, lateral bending and rotation: He has very significant limitation bending to the right, and he also has pain with  rotational movements.  No limitation in extension and forward flexion is modestly impaired.  No echymosis or edema Rises to examination table with mild difficulty Gait: minimally antalgic  Inspection/Deformity: N Paraspinus Tenderness: L4-S1 bilateral, left greater than right  B Ankle Dorsiflexion (L5,4): 5/5 B Great Toe Dorsiflexion (L5,4): 5/5 Heel Walk (L5): WNL Toe Walk (S1): WNL Rise/Squat (L4): WNL, mild pain  SENSORY B Medial Foot (L4): WNL B Dorsum (L5): WNL B Lateral (S1): WNL Light Touch: WNL Pinprick: WNL  REFLEXES Knee (L4): 2+ Ankle (S1): 2+  B SLR, seated: Positive on the left  B Greater Troch: NT B Log Roll: neg B Sciatic Notch: Tender to palpation  Radiology: No results found.  Assessment and Plan:     ICD-10-CM   1. Acute left-sided low back pain with left-sided sciatica  M54.42 DG Lumbar Spine Complete   Acute discogenic left-sided back pain with occasional numbness and tingling in the lower extremity.  For now, pulse with steroids, pain medication as needed.  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  He will have some decreased need to be able to  pick and move things.  Meds ordered this encounter  Medications  . DISCONTD: predniSONE (DELTASONE) 20 MG tablet    Sig: 2 tabs po for 7 days, then 1 tab po for 7 days    Dispense:  21 tablet    Refill:  0  . DISCONTD: HYDROcodone-acetaminophen (NORCO/VICODIN) 5-325 MG tablet    Sig: Take 1 tablet by mouth every 6 (six) hours as needed for moderate pain or severe pain.    Dispense:  20 tablet    Refill:  0   Medications Discontinued During This Encounter  Medication Reason  . predniSONE (DELTASONE) 20 MG tablet    Orders Placed This Encounter  Procedures  . DG Lumbar Spine Complete    Follow-up: Return in about 3 weeks (around 09/17/2020).  Signed,  Nathan Deed.  Kaula Klenke, MD   Outpatient Encounter Medications as of 08/27/2020  Medication Sig  . cyclobenzaprine (FLEXERIL) 10 MG tablet Take 1 tablet (10 mg total) by mouth 3 (three) times daily as needed for muscle spasms.  . hydrochlorothiazide (MICROZIDE) 12.5 MG capsule TAKE 1 CAPSULE BY MOUTH EVERY DAY  . losartan (COZAAR) 50 MG tablet TAKE 1 TABLET BY MOUTH EVERY DAY  . Multiple Vitamin (MULTIVITAMIN) tablet Take 1 tablet by mouth daily.  . [DISCONTINUED] HYDROcodone-acetaminophen (NORCO/VICODIN) 5-325 MG tablet Take 1 tablet by mouth every 6 (six) hours as needed for moderate pain or severe pain.  . [DISCONTINUED] predniSONE (DELTASONE) 20 MG tablet Take 3 tablets by mouth once daily for 2 days, then 2 tablets for 3 days, then 1 tablet for 3 days.  . [DISCONTINUED] predniSONE (DELTASONE) 20 MG tablet 2 tabs po for 7 days, then 1 tab po for 7 days   No facility-administered encounter medications on file as of 08/27/2020.

## 2020-08-27 NOTE — Telephone Encounter (Signed)
Spoke with Chrissie Noa.  He would like both prescriptions sent to [facility].  I called [facility] and cancelled the Prednisone and Norco that was sent to them earlier today.

## 2020-08-27 NOTE — Telephone Encounter (Signed)
Pt called in he was here earlier and was prescribed medication and [facility] called and told him that are unable to get the medication and wanted to know about switching to [facility] [address] in [place]

## 2021-03-27 ENCOUNTER — Ambulatory Visit (INDEPENDENT_AMBULATORY_CARE_PROVIDER_SITE_OTHER): Payer: Commercial Managed Care - PPO

## 2021-03-27 ENCOUNTER — Other Ambulatory Visit: Payer: Self-pay

## 2021-03-27 ENCOUNTER — Ambulatory Visit: Payer: Commercial Managed Care - PPO | Admitting: Nurse Practitioner

## 2021-03-27 VITALS — BP 138/82 | HR 76 | Temp 98.2°F | Resp 16 | Ht 69.75 in | Wt 285.4 lb

## 2021-03-27 DIAGNOSIS — M5442 Lumbago with sciatica, left side: Secondary | ICD-10-CM

## 2021-03-27 MED ORDER — HYDROCODONE-ACETAMINOPHEN 5-325 MG PO TABS: 1.0000 | ORAL_TABLET | Freq: Three times a day (TID) | ORAL | 0 refills | Status: DC | PRN

## 2021-03-27 MED ORDER — PREDNISONE 20 MG PO TABS: ORAL_TABLET | ORAL | 0 refills | Status: AC

## 2021-03-27 NOTE — Assessment & Plan Note (Signed)
Patient presents with flareup of back pain.  He was lifting at work and this was the inciting injury.  Patient has been treated with the same in the past given that it is "worst" flareup he is had we will obtain back x-ray just to make sure no changes per imaging.  We will start him on low-dose hydrocodone he can take up to 3 times a day.  Start with steroid burst.  Patient states muscle relaxer in the past was not of much benefit defer writing that for now.  Did give patient a work note he can return on Wednesday sooner if feeling better and try to avoid lifting heavy objects if possible. Did review red flag symptoms as when to seek emergency health care.  Patient acknowledged.

## 2021-03-27 NOTE — Progress Notes (Signed)
Acute Office Visit  Subjective:    Patient ID: Nathan Santos, male    DOB: 1965/10/11, 55 y.o.   MRN: [id number]  Chief Complaint  Patient presents with   Back Pain    Lifted heavy items at work on 03/23/21 and could tell after that his back was flared up, the last 2 days has gotten much worse. Spasming, sharp pain, dull pain. Pain located in the lower back area all the way across and radiates to the left leg.    Back Pain Associated symptoms include numbness (intermittent) and weakness (subjective). Pertinent negatives include no chest pain or fever.  Patient is in today for Back Pain  Patient bent over and twisted and lifted a heavy object at work. After that had mild back pain. Yesterday flare dup and then today worse.   Left sided sciatica.  No B/B. Does have sharp shooting pain that is intermittent in nature and left sided Has history of the same and has had flare ups. States this on feels worse than others Patient also wants a note for work     Past Medical History:  Diagnosis Date   Allergic rhinitis    Hypertension    Tobacco abuse     Past Surgical History:  Procedure Laterality Date   LAPAROSCOPIC CHOLECYSTECTOMY      Family History  Problem Relation Age of Onset   Heart disease Father    Colon cancer Neg Hx     Social History   Socioeconomic History   Marital status: Married    Spouse name: Not on file   Number of children: 1   Years of education: Not on file   Highest education level: Not on file  Occupational History   Occupation: PLASTICS RECYCLING  Tobacco Use   Smoking status: Some Days    Types: Cigarettes    Last attempt to quit: 05/21/2014    Years since quitting: 6.8   Smokeless tobacco: Former  Substance and Sexual Activity   Alcohol use: Yes    Alcohol/week: 0.0 standard drinks    Comment: occ 3 times a month   Drug use: No   Sexual activity: Not on file  Other Topics Concern   Not on file  Social History Narrative    REGULAR EXERCISE- YES   Social Determinants of Health   Financial Resource Strain: Not on file  Food Insecurity: Not on file  Transportation Needs: Not on file  Physical Activity: Not on file  Stress: Not on file  Social Connections: Not on file  Intimate Partner Violence: Not on file    Outpatient Medications Prior to Visit  Medication Sig Dispense Refill   hydrochlorothiazide (MICROZIDE) 12.5 MG capsule TAKE 1 CAPSULE BY MOUTH EVERY DAY 90 capsule 3   losartan (COZAAR) 50 MG tablet TAKE 1 TABLET BY MOUTH EVERY DAY 90 tablet 3   Multiple Vitamin (MULTIVITAMIN) tablet Take 1 tablet by mouth daily.     cyclobenzaprine (FLEXERIL) 10 MG tablet Take 1 tablet (10 mg total) by mouth 3 (three) times daily as needed for muscle spasms. 15 tablet 0   HYDROcodone-acetaminophen (NORCO/VICODIN) 5-325 MG tablet Take 1 tablet by mouth every 6 (six) hours as needed for moderate pain or severe pain. 20 tablet 0   predniSONE (DELTASONE) 20 MG tablet 2 tabs po for 7 days, then 1 tab po for 7 days 21 tablet 0   No facility-administered medications prior to visit.    No Known Allergies  Review of Systems  Constitutional:  Negative for chills and fever.  Respiratory:  Negative for shortness of breath.   Cardiovascular:  Negative for chest pain.  Gastrointestinal:  Negative for diarrhea, nausea and vomiting.  Musculoskeletal:  Positive for back pain and myalgias.  Neurological:  Positive for weakness (subjective) and numbness (intermittent).      Objective:    Physical Exam Vitals and nursing note reviewed.  Constitutional:      Appearance: He is obese.  Cardiovascular:     Rate and Rhythm: Normal rate and regular rhythm.     Pulses:          Dorsalis pedis pulses are 2+ on the right side and 2+ on the left side.       Posterior tibial pulses are 1+ on the right side and 1+ on the left side.  Pulmonary:     Effort: Pulmonary effort is normal.     Breath sounds: Normal breath sounds.   Abdominal:     General: Bowel sounds are normal.  Musculoskeletal:     Comments: Upper extremities 5/5 bilateral strength Bilateral lower extremity strength 5/5. Left SLR + Bilateral hip flexion strength 4/5 Decreased trunk ROM. Right side flexion increased pain Bending forward and coming to erect state causes pain Antalgic gait  Neurological:     Mental Status: He is alert.     Deep Tendon Reflexes:     Reflex Scores:      Patellar reflexes are 2+ on the right side and 2+ on the left side.   BP 138/82   Pulse 76   Temp 98.2 F (36.8 C)   Resp 16   Ht 5' 9.75" (1.772 m)   Wt 285 lb 7 oz (129.5 kg)   SpO2 96%   BMI 41.25 kg/m  Wt Readings from Last 3 Encounters:  03/27/21 285 lb 7 oz (129.5 kg)  08/27/20 282 lb 8 oz (128.1 kg)  08/22/20 281 lb (127.5 kg)    Health Maintenance Due  Topic Date Due   COVID-19 Vaccine (1) Never done   Hepatitis C Screening  Never done   Zoster Vaccines- Shingrix (1 of 2) Never done   TETANUS/TDAP  11/19/2017   COLONOSCOPY (Pts 45-96yrs Insurance coverage will need to be confirmed)  08/10/2019   INFLUENZA VACCINE  01/19/2021    There are no preventive care reminders to display for this patient.   Lab Results  Component Value Date   TSH 1.20 05/22/2015   Lab Results  Component Value Date   WBC 6.7 05/07/2020   HGB 17.0 05/07/2020   HCT 50.6 05/07/2020   MCV 95.3 05/07/2020   PLT 220.0 05/07/2020   Lab Results  Component Value Date   NA 137 05/07/2020   K 4.8 05/07/2020   CO2 30 05/07/2020   GLUCOSE 107 (H) 05/07/2020   BUN 15 05/07/2020   CREATININE 0.88 05/07/2020   BILITOT 0.6 05/07/2020   ALKPHOS 83 05/07/2020   AST 17 05/07/2020   ALT 25 05/07/2020   PROT 7.3 05/07/2020   ALBUMIN 4.4 05/07/2020   CALCIUM 10.0 05/07/2020   ANIONGAP 8 07/26/2013   GFR 96.41 05/07/2020   Lab Results  Component Value Date   CHOL 234 (H) 05/07/2020   Lab Results  Component Value Date   HDL 67.20 05/07/2020   Lab Results   Component Value Date   LDLCALC 140 (H) 05/07/2020   Lab Results  Component Value Date   TRIG 135.0 05/07/2020   Lab Results  Component Value  Date   CHOLHDL 3 05/07/2020   Lab Results  Component Value Date   HGBA1C 5.6 05/07/2020       Assessment & Plan:   Problem List Items Addressed This Visit       Nervous and Auditory   Acute left-sided low back pain with left-sided sciatica - Primary    Patient presents with flareup of back pain.  He was lifting at work and this was the inciting injury.  Patient has been treated with the same in the past given that it is "worst" flareup he is had we will obtain back x-ray just to make sure no changes per imaging.  We will start him on low-dose hydrocodone he can take up to 3 times a day.  Start with steroid burst.  Patient states muscle relaxer in the past was not of much benefit defer writing that for now.  Did give patient a work note he can return on Wednesday sooner if feeling better and try to avoid lifting heavy objects if possible. Did review red flag symptoms as when to seek emergency health care.  Patient acknowledged.      Relevant Medications   predniSONE (DELTASONE) 20 MG tablet   HYDROcodone-acetaminophen (NORCO/VICODIN) 5-325 MG tablet   Other Relevant Orders   DG Lumbar Spine Complete     No orders of the defined types were placed in this encounter.  This visit occurred during the SARS-CoV-2 public health emergency.  Safety protocols were in place, including screening questions prior to the visit, additional usage of staff PPE, and extensive cleaning of exam room while observing appropriate contact time as indicated for disinfecting solutions.   Romilda Garret, NP

## 2021-03-27 NOTE — Patient Instructions (Signed)
Gentle stretching and getting up and moving around to keep from [place] up Sent medications to pharmacy Will be in touch with xray results. Early next week

## 2021-03-31 ENCOUNTER — Other Ambulatory Visit: Payer: Self-pay | Admitting: Family Medicine

## 2021-03-31 DIAGNOSIS — M5442 Lumbago with sciatica, left side: Secondary | ICD-10-CM

## 2021-03-31 MED ORDER — HYDROCODONE-ACETAMINOPHEN 5-325 MG PO TABS: 1.0000 | ORAL_TABLET | Freq: Three times a day (TID) | ORAL | 0 refills | Status: AC | PRN

## 2021-03-31 NOTE — Telephone Encounter (Signed)
What is the name of the medication? HYDROcodone-acetaminophen (NORCO/VICODIN) 5-325 MG tablet [[id number]]   Have you contacted your pharmacy to request a refill? He is wanting a refill, because he is having a lot of back pain.   Which pharmacy would you like this sent to? [id number] [address], [place] [address], [place] [address]  Phone:  [phone]  Fax:  [phone]  DEA #:  [id number]   Patient notified that their request is being sent to the clinical staff for review and that they should receive a call once it is complete. If they do not receive a call within 72 hours they can check with their pharmacy or our office.

## 2021-03-31 NOTE — Telephone Encounter (Signed)
Spoke with patient. Patient has been taking Norco 3 tablets daily due to the pain.Last filled by Romilda Garret during office visit on 03/27/21 for back pain. Quantity #15.  Advised patient I would let Matt review this request.

## 2021-06-16 ENCOUNTER — Other Ambulatory Visit: Payer: Self-pay | Admitting: Family Medicine

## 2021-06-16 ENCOUNTER — Telehealth: Payer: Self-pay | Admitting: Family Medicine

## 2021-06-16 MED ORDER — HYDROCHLOROTHIAZIDE 12.5 MG PO CAPS: 12.5000 mg | ORAL_CAPSULE | Freq: Every day | ORAL | 0 refills | Status: DC

## 2021-06-16 NOTE — Telephone Encounter (Signed)
Refill sent as requested.  Please schedule CPE with fasting labs prior sometime in the next 3 months with Dr. Lorelei Pont.

## 2021-06-16 NOTE — Telephone Encounter (Signed)
What is the name of the medication? hydrochlorothiazide (MICROZIDE) 12.5 MG capsule [[id number]]   Have you contacted your pharmacy to request a refill? Pt is going out of town for work and is needing a refill on this script.   Which pharmacy would you like this sent to? [id number] [address], [address] [place], [place] [address]  Phone:  [phone]  Fax:  [phone]  DEA #:  [id number]   Patient notified that their request is being sent to the clinical staff for review and that they should receive a call once it is complete. If they do not receive a call within 72 hours they can check with their pharmacy or our office.

## 2021-06-16 NOTE — Telephone Encounter (Signed)
Sent mychart letter and also lvm for pt to call the office to get scheduled for lab/cpe

## 2021-06-16 NOTE — Telephone Encounter (Signed)
Please schedule CPE with fasting labs prior with Dr. Copland.  

## 2021-06-17 NOTE — Telephone Encounter (Signed)
2nd attempt  LMTCB to schedule CPE and labs

## 2021-06-17 NOTE — Telephone Encounter (Signed)
Lvm for pt to call and schedule lab/cpe also sent mychart letter

## 2021-08-19 ENCOUNTER — Ambulatory Visit (INDEPENDENT_AMBULATORY_CARE_PROVIDER_SITE_OTHER): Payer: Commercial Managed Care - PPO | Admitting: Family Medicine

## 2021-08-19 ENCOUNTER — Encounter: Payer: Self-pay | Admitting: Family Medicine

## 2021-08-19 ENCOUNTER — Other Ambulatory Visit: Payer: Self-pay

## 2021-08-19 ENCOUNTER — Telehealth: Payer: Self-pay | Admitting: Gastroenterology

## 2021-08-19 VITALS — BP 150/82 | HR 68 | Temp 98.3°F | Wt 294.0 lb

## 2021-08-19 DIAGNOSIS — Z13220 Encounter for screening for lipoid disorders: Secondary | ICD-10-CM | POA: Diagnosis not present

## 2021-08-19 DIAGNOSIS — Z0000 Encounter for general adult medical examination without abnormal findings: Secondary | ICD-10-CM

## 2021-08-19 DIAGNOSIS — Z1211 Encounter for screening for malignant neoplasm of colon: Secondary | ICD-10-CM | POA: Diagnosis not present

## 2021-08-19 DIAGNOSIS — I10 Essential (primary) hypertension: Secondary | ICD-10-CM

## 2021-08-19 DIAGNOSIS — Z79899 Other long term (current) drug therapy: Secondary | ICD-10-CM | POA: Diagnosis not present

## 2021-08-19 DIAGNOSIS — Z131 Encounter for screening for diabetes mellitus: Secondary | ICD-10-CM

## 2021-08-19 DIAGNOSIS — F17200 Nicotine dependence, unspecified, uncomplicated: Secondary | ICD-10-CM

## 2021-08-19 DIAGNOSIS — Z23 Encounter for immunization: Secondary | ICD-10-CM | POA: Diagnosis not present

## 2021-08-19 DIAGNOSIS — E538 Deficiency of other specified B group vitamins: Secondary | ICD-10-CM

## 2021-08-19 DIAGNOSIS — F32A Depression, unspecified: Secondary | ICD-10-CM

## 2021-08-19 LAB — LIPID PANEL
Cholesterol: 197 mg/dL (ref 0–200)
HDL: 54.8 mg/dL (ref >39.00)
LDL Cholesterol: 120 mg/dL — ABNORMAL HIGH (ref 0–99)
NonHDL: 141.8
Total CHOL/HDL Ratio: 4
Triglycerides: 111 mg/dL (ref 0.0–149.0)
VLDL: 22.2 mg/dL (ref 0.0–40.0)

## 2021-08-19 LAB — CBC WITH DIFFERENTIAL/PLATELET
Basophils Absolute: 0 10*3/uL (ref 0.0–0.1)
Basophils Relative: 0.4 % (ref 0.0–3.0)
Eosinophils Absolute: 0.1 10*3/uL (ref 0.0–0.7)
Eosinophils Relative: 1.5 % (ref 0.0–5.0)
HCT: 48.2 % (ref 39.0–52.0)
Hemoglobin: 16.5 g/dL (ref 13.0–17.0)
Lymphocytes Relative: 28.5 % (ref 12.0–46.0)
Lymphs Abs: 1.6 10*3/uL (ref 0.7–4.0)
MCHC: 34.2 g/dL (ref 30.0–36.0)
MCV: 95.8 fl (ref 78.0–100.0)
Monocytes Absolute: 0.4 10*3/uL (ref 0.1–1.0)
Monocytes Relative: 6.8 % (ref 3.0–12.0)
Neutro Abs: 3.5 10*3/uL (ref 1.4–7.7)
Neutrophils Relative %: 62.8 % (ref 43.0–77.0)
Platelets: 199 10*3/uL (ref 150.0–400.0)
RBC: 5.03 Mil/uL (ref 4.22–5.81)
RDW: 14.1 % (ref 11.5–15.5)
WBC: 5.6 10*3/uL (ref 4.0–10.5)

## 2021-08-19 LAB — BASIC METABOLIC PANEL
BUN: 12 mg/dL (ref 6–23)
CO2: 29 mEq/L (ref 19–32)
Calcium: 9.4 mg/dL (ref 8.4–10.5)
Chloride: 100 mEq/L (ref 96–112)
Creatinine, Ser: 0.79 mg/dL (ref 0.40–1.50)
GFR: 98.71 mL/min (ref >60.00)
Glucose, Bld: 104 mg/dL — ABNORMAL HIGH (ref 70–99)
Potassium: 4.3 mEq/L (ref 3.5–5.1)
Sodium: 136 mEq/L (ref 135–145)

## 2021-08-19 LAB — HEPATIC FUNCTION PANEL
ALT: 20 U/L (ref 0–53)
AST: 15 U/L (ref 0–37)
Albumin: 4.3 g/dL (ref 3.5–5.2)
Alkaline Phosphatase: 78 U/L (ref 39–117)
Bilirubin, Direct: 0.1 mg/dL (ref 0.0–0.3)
Total Bilirubin: 0.5 mg/dL (ref 0.2–1.2)
Total Protein: 6.9 g/dL (ref 6.0–8.3)

## 2021-08-19 LAB — VITAMIN B12: Vitamin B-12: 218 pg/mL (ref 211–911)

## 2021-08-19 LAB — HEMOGLOBIN A1C: Hgb A1c MFr Bld: 5.8 % (ref 4.6–6.5)

## 2021-08-19 MED ORDER — FLUOXETINE HCL 20 MG PO CAPS: 20.0000 mg | ORAL_CAPSULE | Freq: Every day | ORAL | 3 refills | Status: DC

## 2021-08-19 NOTE — Telephone Encounter (Signed)
Patient has a referral, screening for malignant neoplasm of colon. Patient requests to schedule appt. ?

## 2021-08-19 NOTE — Progress Notes (Signed)
Nathan Bartek T. Sylar Voong, MD, [place] at [facility] [place], [address]  Phone: [phone]   FAX: [phone]  Nathan Santos - 56 y.o. male   MRN [id number]   Date of Birth: 1965-08-28  Date: 08/19/2021   PCP: Nathan Loffler, MD   Referral: Nathan Loffler, MD  Chief Complaint  Patient presents with   Annual Exam    This visit occurred during the SARS-CoV-2 public health emergency.  Safety protocols were in place, including screening questions prior to the visit, additional usage of staff PPE, and extensive cleaning of exam room while observing appropriate contact time as indicated for disinfecting solutions.   Patient Care Team: Nathan Loffler, MD as PCP - General Subjective:   Nathan Santos is a 56 y.o. pleasant patient who presents with the following:  Preventative Health Maintenance Visit:   I have not seen for any kind of health maintenance since 2021.  He also has a few acute issues that need to be addressed right now.  Health Maintenance Summary Reviewed and updated, unless pt declines services.  Tobacco History Reviewed. - smokes some Alcohol: No concerns, no excessive use Exercise Habits: Some activity, rec at least 30 mins 5 times a week - not much right now. STD concerns: no risk or activity to increase risk Drug Use: None  All labs Covid vaccine -  Hep C Shingles vaccine -  Colon cancer screening - yes Tdap - yes Smoking  Struggling with some weight issues - lost about 25 pounds, put it all back on.  This is frustrating.  BP is high.  48 hours of work a week. On his feet at work [place] - will walk some.   Wt Readings from Last 3 Encounters:  08/19/21 294 lb (133.4 kg)  03/27/21 285 lb 7 oz (129.5 kg)  08/27/20 282 lb 8 oz (128.1 kg)     HTN: Tolerating all medications without side effects Stable and at goal No CP, no sob. No HA.  BP Readings from  Last 3 Encounters:  08/19/21 (!) 150/82  03/27/21 138/82  08/27/20 (!) 076/22    Basic Metabolic Panel:    Component Value Date/Time   NA 136 08/19/2021 0953   NA 137 07/26/2013 1049   K 4.3 08/19/2021 0953   K 4.4 07/26/2013 1049   CL 100 08/19/2021 0953   CL 105 07/26/2013 1049   CO2 29 08/19/2021 0953   CO2 24 07/26/2013 1049   BUN 12 08/19/2021 0953   BUN 15 07/26/2013 1049   CREATININE 0.79 08/19/2021 0953   CREATININE 0.94 06/06/2015 1527   GLUCOSE 104 (H) 08/19/2021 0953   GLUCOSE 94 07/26/2013 1049   CALCIUM 9.4 08/19/2021 0953   CALCIUM 9.2 07/26/2013 1049     B12  Also has some depression Does have a disabled son.  Lives with him.  Eating when he is depressed some.  Anhedonia   BP - ate pork   Health Maintenance  Topic Date Due   COVID-19 Vaccine (1) Never done   Hepatitis C Screening  Never done   COLONOSCOPY (Pts 45-50yrs Insurance coverage will need to be confirmed)  08/10/2019   Zoster Vaccines- Shingrix (2 of 2) 10/14/2021   TETANUS/TDAP  08/20/2031   INFLUENZA VACCINE  Completed   HIV Screening  Completed   HPV VACCINES  Aged Out   Immunization History  Administered Date(s) Administered   Influenza,inj,Quad  PF,6+ Mos 07/01/2014   Influenza-Unspecified 04/16/2020, 04/03/2021   Td 11/20/2007   Tdap 08/19/2021   Zoster Recombinat (Shingrix) 08/19/2021   Patient Active Problem List   Diagnosis Date Noted   Essential hypertension, benign 05/22/2015    Priority: Medium    TOBACCO USE 04/24/2008    Priority: Medium    B12 deficiency 06/06/2015   Allergic conjunctivitis and rhinitis, bilateral 05/22/2015   Erectile dysfunction due to arterial insufficiency 07/02/2014    Past Medical History:  Diagnosis Date   Allergic rhinitis    Hypertension    Tobacco abuse     Past Surgical History:  Procedure Laterality Date   LAPAROSCOPIC CHOLECYSTECTOMY      Family History  Problem Relation Age of Onset   Heart disease Father    Colon  cancer Neg Hx     Past Medical History, Surgical History, Social History, Family History, Problem List, Medications, and Allergies have been reviewed and updated if relevant.  Review of Systems: Pertinent positives are listed above.  Otherwise, a full 14 point review of systems has been done in full and it is negative except where it is noted positive.  Objective:   BP (!) 150/82    Pulse 68    Temp 98.3 F (36.8 C) (Temporal)    Wt 294 lb (133.4 kg)    SpO2 94%    BMI 42.49 kg/m  Ideal Body Weight:    Ideal Body Weight:   No results found. Depression screen [facility] 2/9 08/19/2021 05/07/2020 06/23/2017  Decreased Interest - 0 0  Down, Depressed, Hopeless 1 0 0  PHQ - 2 Score 1 0 0     GEN: well developed, well nourished, no acute distress Eyes: conjunctiva and lids normal, PERRLA, EOMI ENT: TM clear, nares clear, oral exam WNL Neck: supple, no lymphadenopathy, no thyromegaly, no JVD Pulm: clear to auscultation and percussion, respiratory effort normal CV: regular rate and rhythm, S1-S2, no murmur, rub or gallop, no bruits, peripheral pulses normal and symmetric, no cyanosis, clubbing, edema or varicosities GI: soft, non-tender; no hepatosplenomegaly, masses; active bowel sounds all quadrants GU: deferred Lymph: no cervical, axillary or inguinal adenopathy MSK: gait normal, muscle tone and strength WNL, no joint swelling, effusions, discoloration, crepitus  SKIN: clear, good turgor, color WNL, no rashes, lesions, or ulcerations Neuro: normal mental status, normal strength, sensation, and motion Psych: alert; oriented to person, place and time,   Flat affect.  All labs reviewed with patient. Results for orders placed or performed in visit on 62/13/08  Basic metabolic panel  Result Value Ref Range   Sodium 136 135 - 145 mEq/L   Potassium 4.3 3.5 - 5.1 mEq/L   Chloride 100 96 - 112 mEq/L   CO2 29 19 - 32 mEq/L   Glucose, Bld 104 (H) 70 - 99 mg/dL   BUN 12 6 - 23 mg/dL    Creatinine, Ser 0.79 0.40 - 1.50 mg/dL   GFR 98.71 >60.00 mL/min   Calcium 9.4 8.4 - 10.5 mg/dL  CBC with Differential/Platelet  Result Value Ref Range   WBC 5.6 4.0 - 10.5 K/uL   RBC 5.03 4.22 - 5.81 Mil/uL   Hemoglobin 16.5 13.0 - 17.0 g/dL   HCT 48.2 39.0 - 52.0 %   MCV 95.8 78.0 - 100.0 fl   MCHC 34.2 30.0 - 36.0 g/dL   RDW 14.1 11.5 - 15.5 %   Platelets 199.0 150.0 - 400.0 K/uL   Neutrophils Relative % 62.8 43.0 - 77.0 %  Lymphocytes Relative 28.5 12.0 - 46.0 %   Monocytes Relative 6.8 3.0 - 12.0 %   Eosinophils Relative 1.5 0.0 - 5.0 %   Basophils Relative 0.4 0.0 - 3.0 %   Neutro Abs 3.5 1.4 - 7.7 K/uL   Lymphs Abs 1.6 0.7 - 4.0 K/uL   Monocytes Absolute 0.4 0.1 - 1.0 K/uL   Eosinophils Absolute 0.1 0.0 - 0.7 K/uL   Basophils Absolute 0.0 0.0 - 0.1 K/uL  Hepatic function panel  Result Value Ref Range   Total Bilirubin 0.5 0.2 - 1.2 mg/dL   Bilirubin, Direct 0.1 0.0 - 0.3 mg/dL   Alkaline Phosphatase 78 39 - 117 U/L   AST 15 0 - 37 U/L   ALT 20 0 - 53 U/L   Total Protein 6.9 6.0 - 8.3 g/dL   Albumin 4.3 3.5 - 5.2 g/dL  Hemoglobin A1c  Result Value Ref Range   Hgb A1c MFr Bld 5.8 4.6 - 6.5 %  Lipid panel  Result Value Ref Range   Cholesterol 197 0 - 200 mg/dL   Triglycerides 111.0 0.0 - 149.0 mg/dL   HDL 54.80 >39.00 mg/dL   VLDL 22.2 0.0 - 40.0 mg/dL   LDL Cholesterol 120 (H) 0 - 99 mg/dL   Total CHOL/HDL Ratio 4    NonHDL 141.80   Vitamin B12  Result Value Ref Range   Vitamin B-12 218 211 - 911 pg/mL    Assessment and Plan:     ICD-10-CM   1. Healthcare maintenance  Z00.00     2. Screening for malignant neoplasm of colon  Z12.11 Ambulatory referral to Gastroenterology    3. Screening for diabetes mellitus  Z13.1 Hemoglobin A1c    4. Screening, lipid  Z13.220 Lipid panel    5. Encounter for long-term (current) use of medications  G88.110 Basic metabolic panel    CBC with Differential/Platelet    Hepatic function panel    6. B12 deficiency  E53.8  Vitamin B12    7. Need for Tdap vaccination  Z23 Tdap vaccine greater than or equal to 7yo IM    8. Need for shingles vaccine  Z23 Varicella-zoster vaccine IM (Shingrix)    9. Smoker  F17.200     10. Acute depression  F32.A     11. Hypertension, essential  I10     12. Morbid obesity ([place])  E66.01      It is time for repeat colonoscopy He does need a Tdap as well as Shingrix vaccine. Consider COVID BiValent He really needs to stop smoking, and we went over this in some detail B12 is mild/borderline low  Additional ongoing medical concerns that needed to be addressed acutely. 1.  Acute depression.  This has been ongoing for about a year, but this has been worsening over time.  He is working about 48 hours a week, and he is having difficulty being active.  His weight is also playing a role here.  He does not have any SI or HI, but he is having anhedonia.  We reviewed neurotransmitter function and imbalances.  He does need to go on medication my opinion, and I am going to place the patient on Prozac with close follow-up.  He knows to call if he does poorly. 2.  Decompensated hypertension.  Right now is elevated at 150/82.  Think that his weight gain probably is playing a role here as well as inactivity.  He is going to work on these things and we can do a  recheck on his next follow-up office visit for this and depression. 3.  His weight continues to escalate, and now his BMI is 42.  This certainly contributes to #2, and #1.   Health Maintenance Exam: The patient's preventative maintenance and recommended screening tests for an annual wellness exam were reviewed in full today. Brought up to date unless services declined.  Counselled on the importance of diet, exercise, and its role in overall health and mortality. The patient's FH and SH was reviewed, including their home life, tobacco status, and drug and alcohol status.  Follow-up in 1 year for physical exam or additional follow-up  below.  Follow-up: Return in about 6 weeks (around 09/30/2021). Or follow-up in 1 year if not noted.  Meds ordered this encounter  Medications   FLUoxetine (PROZAC) 20 MG capsule    Sig: Take 1 capsule (20 mg total) by mouth daily.    Dispense:  30 capsule    Refill:  3   There are no discontinued medications. Orders Placed This Encounter  Procedures   Tdap vaccine greater than or equal to 7yo IM   Varicella-zoster vaccine IM (Shingrix)   Basic metabolic panel   CBC with Differential/Platelet   Hepatic function panel   Hemoglobin A1c   Lipid panel   Vitamin B12   Ambulatory referral to Gastroenterology    Signed,  Frederico Hamman T. Jermari Tamargo, MD   Allergies as of 08/19/2021   No Known Allergies      Medication List        Accurate as of August 19, 2021 11:59 PM. If you have any questions, ask your nurse or doctor.          FLUoxetine 20 MG capsule Commonly known as: PROZAC Take 1 capsule (20 mg total) by mouth daily. Started by: Nathan Loffler, MD   hydrochlorothiazide 12.5 MG capsule Commonly known as: MICROZIDE Take 1 capsule (12.5 mg total) by mouth daily.   losartan 50 MG tablet Commonly known as: COZAAR TAKE 1 TABLET BY MOUTH EVERY DAY   multivitamin tablet Take 1 tablet by mouth daily.

## 2021-08-20 ENCOUNTER — Telehealth: Payer: Self-pay

## 2021-08-20 NOTE — Telephone Encounter (Signed)
CALLED PATIENT NO ANSWER LEFT VOICEMAIL FOR A CALL BACK °Letter sent °

## 2021-08-20 NOTE — Telephone Encounter (Signed)
CALLED PATIENT NO ANSWER LEFT VOICEMAIL FOR A CALL BACK ? ?

## 2021-09-12 ENCOUNTER — Other Ambulatory Visit: Payer: Self-pay | Admitting: Family Medicine

## 2021-09-20 ENCOUNTER — Other Ambulatory Visit: Payer: Self-pay | Admitting: Family Medicine

## 2021-09-21 ENCOUNTER — Ambulatory Visit: Payer: Commercial Managed Care - PPO | Admitting: Family Medicine

## 2021-09-21 ENCOUNTER — Encounter: Payer: Self-pay | Admitting: Family Medicine

## 2021-09-21 VITALS — BP 130/80 | HR 71 | Temp 98.4°F | Ht 70.5 in | Wt 285.6 lb

## 2021-09-21 DIAGNOSIS — F32A Depression, unspecified: Secondary | ICD-10-CM | POA: Diagnosis not present

## 2021-09-21 MED ORDER — CITALOPRAM HYDROBROMIDE 20 MG PO TABS: 20.0000 mg | ORAL_TABLET | Freq: Every day | ORAL | 3 refills | Status: DC

## 2021-09-21 NOTE — Patient Instructions (Addendum)
Stop Fluoxetine. ? ?After 4 days, start the Citalopram.  For the first week, take it every other day, then increase it to 1 tablet at night. ? ? ?

## 2021-09-21 NOTE — Progress Notes (Signed)
? ? ?Nathan Santos T. Nathan Jons, MD, [facility] ?Therapist, music at [facility] ?[place] ?[place], [address] ? ?Phone: [phone]  FAX: [phone] ? ?Nathan Santos - 56 y.o. male  MRN [id number]  Date of Birth: 05/26/1966 ? ?Date: 09/21/2021  PCP: Owens Loffler, MD  Referral: Owens Loffler, MD ? ?Chief Complaint  ?Patient presents with  ? Follow-up  ?  Fluoxetine start  ? ? ?This visit occurred during the SARS-CoV-2 public health emergency.  Safety protocols were in place, including screening questions prior to the visit, additional usage of staff PPE, and extensive cleaning of exam room while observing appropriate contact time as indicated for disinfecting solutions.  ? ?Subjective:  ? ?Nathan Santos is a 56 y.o. very pleasant male patient with Body mass index is 40.39 kg/m?. who presents with the following: ? ?He is here to follow-up regarding some recent lab work. ? ?Sleepy some with the prozac.  Still not looking forward to things.  Some anhedonia.  ?Not as much interest.  ?No SI or HI. ? ?Not looking forward. To things.  ? ?Change to celexa ? ?See below ? ?Review of Systems is noted in the HPI, as appropriate ? ?Objective:  ? ?BP 130/80   Pulse 71   Temp 98.4 ?F (36.9 ?C) (Oral)   Ht 5' 10.5" (1.791 m)   Wt 285 lb 9 oz (129.5 kg)   SpO2 94%   BMI 40.39 kg/m?  ? ?GEN: No acute distress; alert,appropriate. ?PULM: Breathing comfortably in no respiratory distress ?PSYCH: Normally interactive.  ? ?Laboratory and Imaging Data: ? ?Assessment and Plan:  ? ?  ICD-10-CM   ?1. Essential hypertension, benign  I10   ?  ? ?At this point, 1 month into his Prozac usage she does not feel any better.  He also is feeling drowsy during the daytime, and also at nighttime.  He is sleeping well, but he is feeling drowsy much of the day.  He is hitting his snooze button a lot.  Even slipped and missed work on Saturday. ? ?Continues to have anhedonia, decreased interest in activities,  decreased energy.  He denies guilt.  No SI or HI. ? ?Patient Instructions  ?Stop Fluoxetine. ? ?After 4 days, start the Citalopram.  For the first week, take it every other day, then increase it to 1 tablet at night. ? ?  ? ?Meds ordered this encounter  ?Medications  ? citalopram (CELEXA) 20 MG tablet  ?  Sig: Take 1 tablet (20 mg total) by mouth at bedtime.  ?  Dispense:  30 tablet  ?  Refill:  3  ? ?Medications Discontinued During This Encounter  ?Medication Reason  ? FLUoxetine (PROZAC) 20 MG capsule   ? ?No orders of the defined types were placed in this encounter. ? ? ?Follow-up: Return in about 6 weeks (around 11/02/2021) for depression. ? ?Dragon [employer] One speech-to-text software was used for transcription in this dictation.  Possible transcriptional errors can occur using Editor, commissioning.  ? ?Signed, ? ?Toniyah Dilmore T. Madden Garron, MD ? ? ?Outpatient Encounter Medications as of 09/21/2021  ?Medication Sig  ? citalopram (CELEXA) 20 MG tablet Take 1 tablet (20 mg total) by mouth at bedtime.  ? hydrochlorothiazide (MICROZIDE) 12.5 MG capsule Take 1 capsule (12.5 mg total) by mouth daily.  ? losartan (COZAAR) 50 MG tablet TAKE 1 TABLET BY MOUTH EVERY DAY  ? Multiple Vitamin (MULTIVITAMIN) tablet Take 1 tablet by mouth daily.  ? [DISCONTINUED] FLUoxetine (PROZAC) 20  MG capsule TAKE 1 CAPSULE BY MOUTH EVERY DAY  ? ?No facility-administered encounter medications on file as of 09/21/2021.  ?  ?

## 2021-10-19 ENCOUNTER — Other Ambulatory Visit: Payer: Self-pay | Admitting: Family Medicine

## 2021-10-22 ENCOUNTER — Ambulatory Visit: Payer: Commercial Managed Care - PPO

## 2021-10-29 ENCOUNTER — Ambulatory Visit (INDEPENDENT_AMBULATORY_CARE_PROVIDER_SITE_OTHER): Payer: Commercial Managed Care - PPO

## 2021-10-29 DIAGNOSIS — Z23 Encounter for immunization: Secondary | ICD-10-CM | POA: Diagnosis not present

## 2021-10-30 ENCOUNTER — Telehealth: Payer: Self-pay | Admitting: Family Medicine

## 2021-10-30 MED ORDER — HYDROCHLOROTHIAZIDE 12.5 MG PO CAPS: 12.5000 mg | ORAL_CAPSULE | Freq: Every day | ORAL | 3 refills | Status: DC

## 2021-10-30 NOTE — Telephone Encounter (Signed)
Spoke with Nathan Santos.  He states he stopped the citalopram because he didn't like the way it made him feel.  He had refills available on his losartan but needs refills on his HCTZ.  Refills sent to [facility] on [address] to Dr. Lorelei Pont.  ?

## 2021-10-30 NOTE — Telephone Encounter (Signed)
Pt called and stated this:  "I quit taking my anxiety medication pills and I am out of my blood pressure medicine, I do not remember the name of the medication though. I told the pt we needed to know the name of the medication so we can put his refill in." I told him the nurse will reach out to get more information about this. ? ?Callback Number: [phone] ?

## 2021-12-14 IMAGING — DX DG LUMBAR SPINE COMPLETE 4+V
5 series · 5 of 5 positions shown · non-contrast
Comparison: August 2020

CLINICAL DATA: Acute left low back pain with left sciatica

EXAM:
LUMBAR SPINE - COMPLETE 4+ VIEW

[lumbar spine ap]
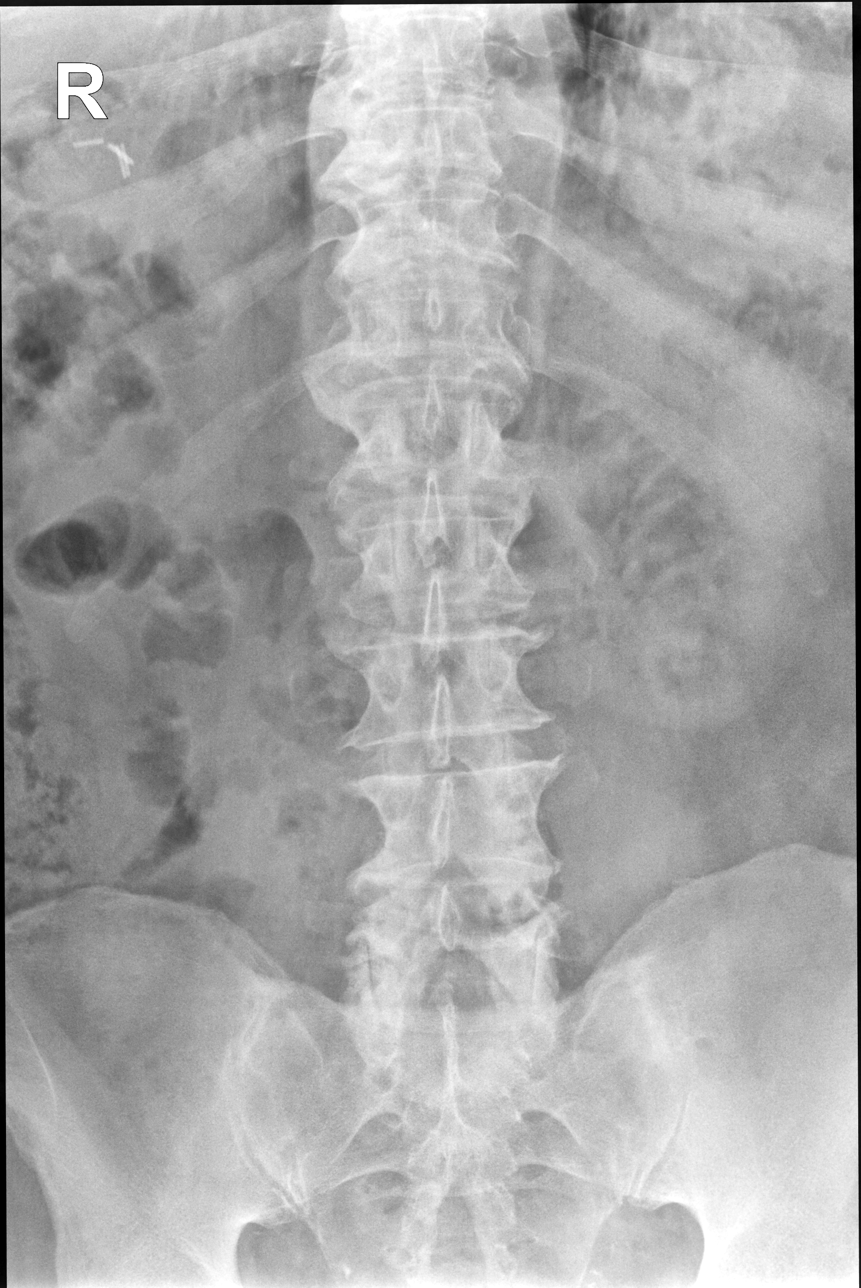

[lumbar spine lmo (1 of 2)]
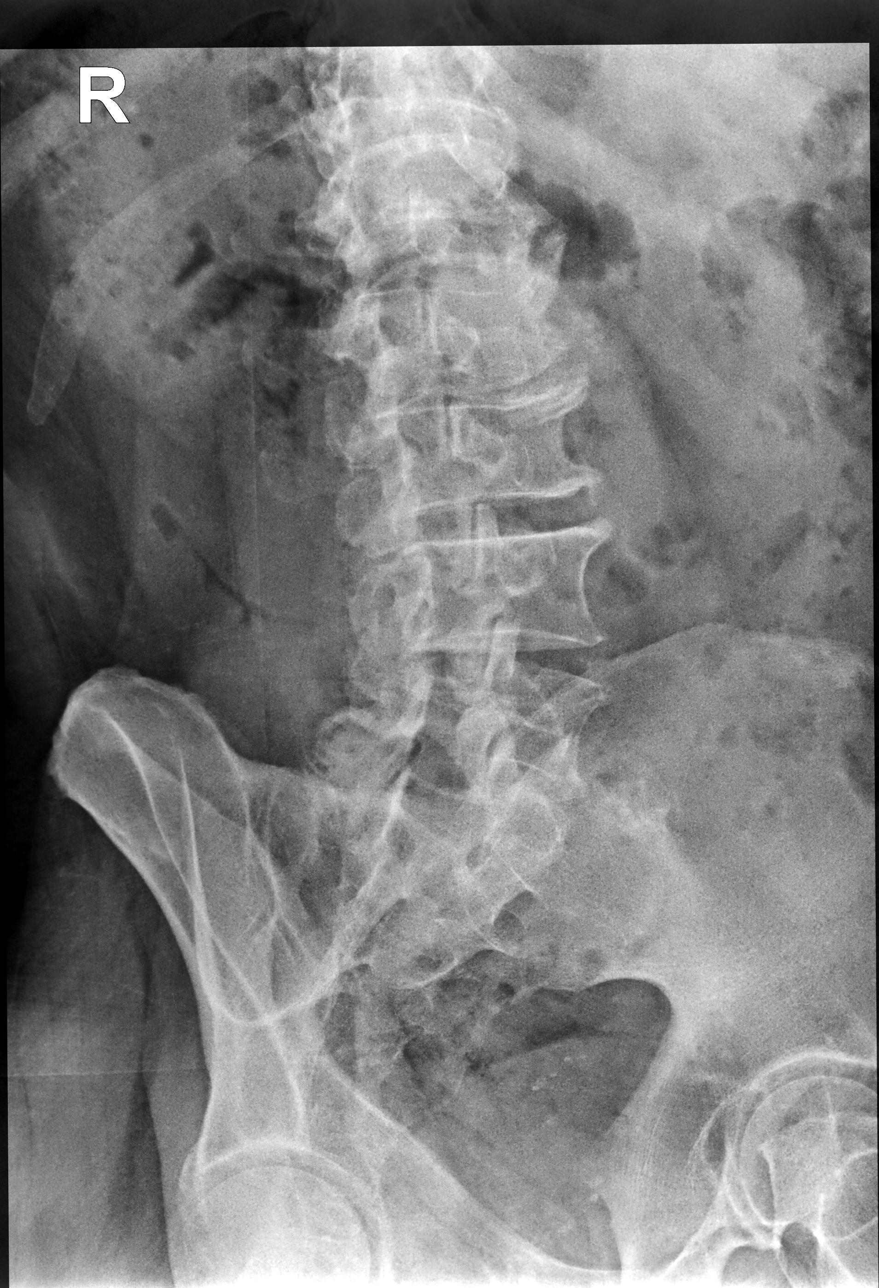

[lumbar spine lmo (2 of 2)]
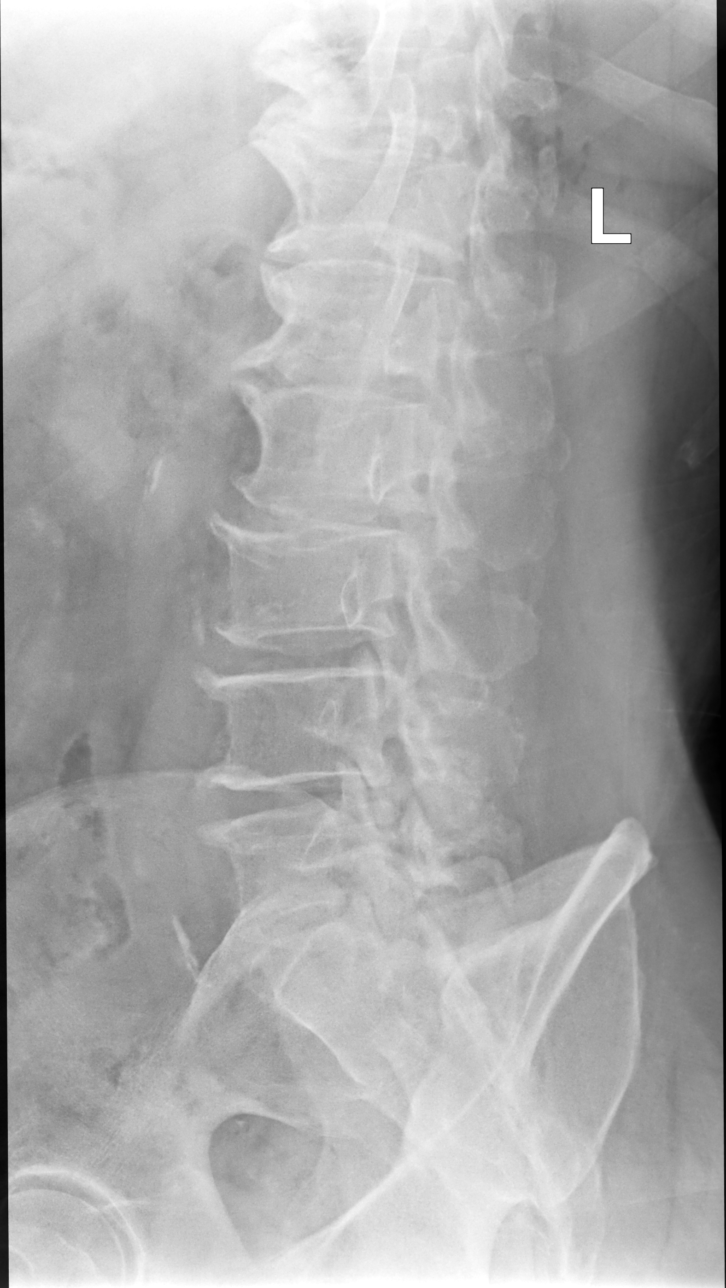

[lumbar spine lat (1 of 2)]
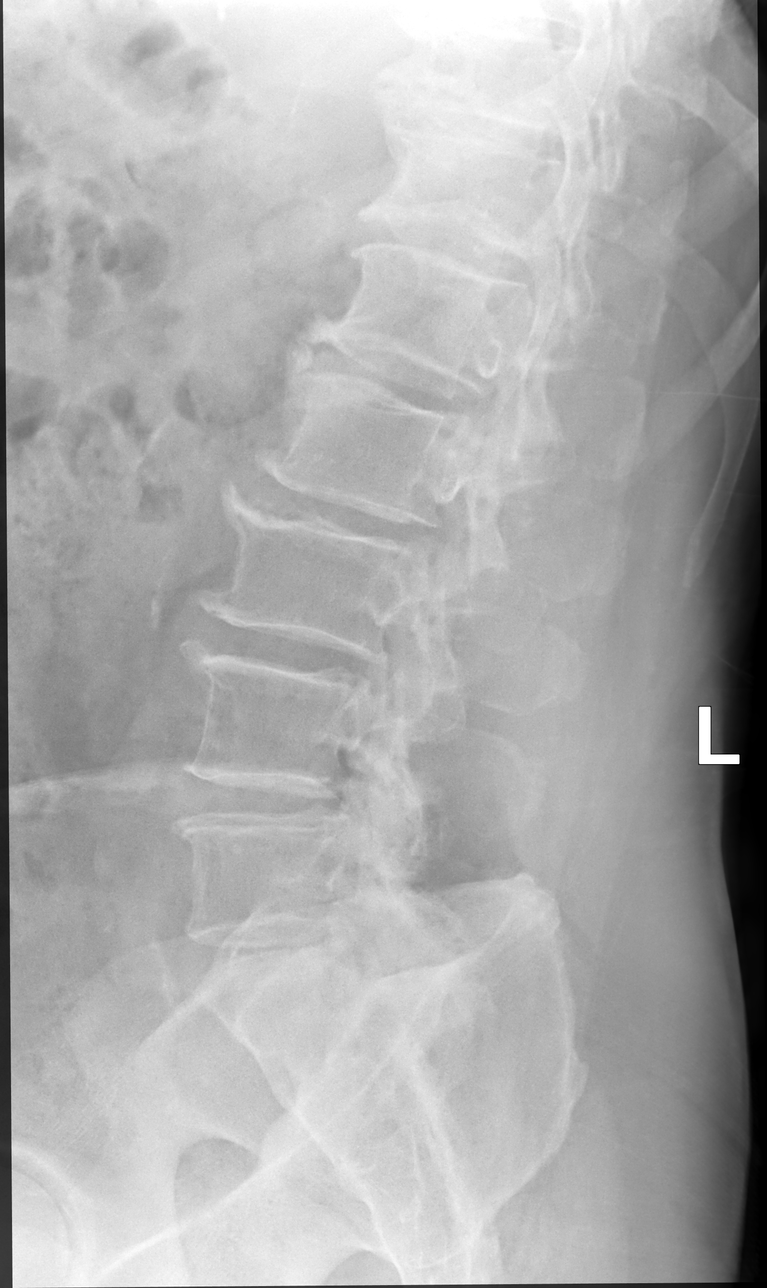

[lumbar spine lat (2 of 2)]
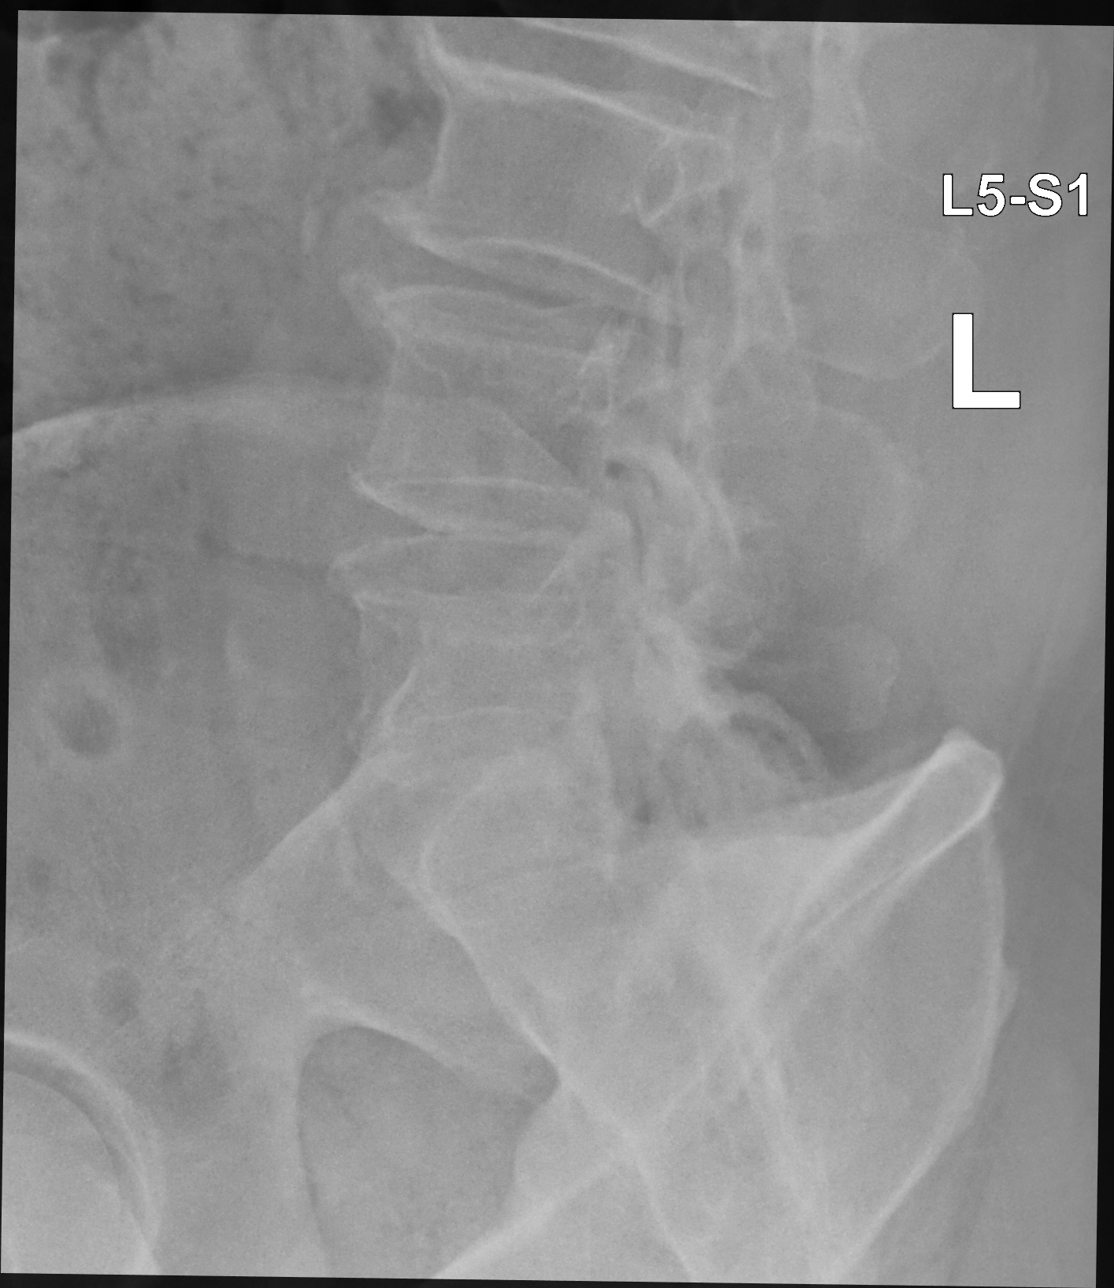

[5 of 5 positions shown; findings below may reference images not displayed]

FINDINGS: Obliquity on the lateral view. No definite significant listhesis.
Vertebral body heights are maintained. Multilevel disc space
narrowing, endplate osteophytes, and facet hypertrophy.
IMPRESSION: Multilevel lumbar spondylosis similar to prior study.

## 2021-12-23 ENCOUNTER — Other Ambulatory Visit: Payer: Self-pay | Admitting: Family Medicine

## 2022-01-28 ENCOUNTER — Other Ambulatory Visit: Payer: Self-pay | Admitting: Family Medicine

## 2022-05-26 ENCOUNTER — Ambulatory Visit (INDEPENDENT_AMBULATORY_CARE_PROVIDER_SITE_OTHER)
Admission: RE | Admit: 2022-05-26 | Discharge: 2022-05-26 | Disposition: A | Discharge status: Home / Self Care | Payer: BLUE CROSS/BLUE SHIELD | Source: Ambulatory Visit | Attending: Family Medicine | Admitting: Family Medicine

## 2022-05-26 ENCOUNTER — Encounter: Payer: Self-pay | Admitting: Family Medicine

## 2022-05-26 ENCOUNTER — Ambulatory Visit: Payer: BLUE CROSS/BLUE SHIELD | Admitting: Family Medicine

## 2022-05-26 VITALS — BP 140/70 | HR 73 | Temp 98.7°F | Ht 70.5 in | Wt 295.0 lb

## 2022-05-26 DIAGNOSIS — M25561 Pain in right knee: Secondary | ICD-10-CM

## 2022-05-26 DIAGNOSIS — M1711 Unilateral primary osteoarthritis, right knee: Secondary | ICD-10-CM | POA: Diagnosis not present

## 2022-05-26 MED ORDER — TRIAMCINOLONE ACETONIDE 40 MG/ML IJ SUSP
40.0000 mg | Freq: Once | INTRAMUSCULAR | Status: AC
  Administered 2022-05-26: 40 mg via INTRA_ARTICULAR

## 2022-05-26 NOTE — Progress Notes (Signed)
Arbell Wycoff T. Marjean Imperato, MD, [place] at [facility] [place], [address]  Phone: [phone]  FAX: [phone]  RICKIE GANGE - 56 y.o. male  MRN [id number]  Date of Birth: Jul 25, 1965  Date: 05/26/2022  PCP: Owens Loffler, MD  Referral: Owens Loffler, MD  Chief Complaint  Patient presents with   Knee Pain    Bilateral but right knee is worse   Subjective:   Nathan Santos is a 56 y.o. very pleasant male patient with Body mass index is 41.73 kg/m. who presents with the following:  He presents today with predominately sided knee pain, and this occurred after he recently started to do a heel walking program and hiking program.  Roughly 1 months ago he twisted his right knee while he was hiking, and he did have some pain and swelling then.  It started to get better, and then he stepped out of his door and hyperextended to the same knee.  Since then it has had pain, swelling and loss of motion.  Has had some problems with it before, and mostly on the right.   Started to do some trail walking.   Has had ice on it all day.  They are particularly bad yesterday, and it is feeling somewhat better today after he has been off of it for the whole day and icing his knee.  He has had some weakness and a sensation of buckling in the knee.  When he hyperextended his knee his knee felt weak and then it hyperextended.   Inj r knee  Review of Systems is noted in the HPI, as appropriate  Objective:   BP (!) 140/70   Pulse 73   Temp 98.7 F (37.1 C) (Oral)   Ht 5' 10.5" (1.791 m)   Wt 295 lb (133.8 kg)   SpO2 96%   BMI 41.73 kg/m   GEN: No acute distress; alert,appropriate. PULM: Breathing comfortably in no respiratory distress PSYCH: Normally interactive.   Right knee: He does have a mild to moderate effusion.  Significant guarding limiting exam.  He lacks 5 degrees of extension.  Flexion to 95 degrees.  Stable to  varus and valgus stress.  Lachman is negative.  Anterior and posterior drawer testing is negative.  Any kind of forced flexion causes pain.  Rest of exam is equivocal secondary to guarding.  Laboratory and Imaging Data:  Assessment and Plan:     ICD-10-CM   1. Acute pain of right knee  M25.561 DG Knee 4 Views W/Patella Right    triamcinolone acetonide (KENALOG-40) injection 40 mg    2. Primary osteoarthritis of right knee  M17.11      Acute on chronic knee osteoarthritis with exacerbation.  Cannot exclude other internal derangement given lack of good exam.  On x-ray he certainly has some mild to moderate degenerative changes tricompartmentally, worse in the patellofemoral compartment.  He may have a loose body or 2 in there as well, it is hard to differentiate this on the plain x-ray.  At least on the AP, it does look like there are 2 areas of concern for possible loose body.  Social: Right now his knee pain is limiting his ability to exercise  Aspiration/Injection Procedure Note MARCELLINO FIDALGO [birth year] Date of procedure: 05/26/2022  Procedure: Large Joint Aspiration / Injection of Knee, R Indications: Pain  Procedure Details Patient verbally consented to procedure. Risks, benefits, and alternatives explained. Sterilely prepped with Chloraprep.  Ethyl cholride used for anesthesia. 9 cc Lidocaine 1% mixed with 1 mL of Kenalog 40 mg injected using the anteromedial approach without difficulty. No complications with procedure and tolerated well. Patient had decreased pain post-injection. Medication: 1 mL of Kenalog 40 mg   Medication Management during today's office visit: Meds ordered this encounter  Medications   triamcinolone acetonide (KENALOG-40) injection 40 mg   Medications Discontinued During This Encounter  Medication Reason   citalopram (CELEXA) 20 MG tablet Completed Course    Orders placed today for conditions managed today: Orders Placed This Encounter   Procedures   DG Knee 4 Views W/Patella Right    Disposition: No follow-ups on file.  Dragon [employer] One speech-to-text software was used for transcription in this dictation.  Possible transcriptional errors can occur using Editor, commissioning.   Signed,  Maud Deed. Erian Lariviere, MD   Outpatient Encounter Medications as of 05/26/2022  Medication Sig   hydrochlorothiazide (MICROZIDE) 12.5 MG capsule Take 1 capsule (12.5 mg total) by mouth daily.   losartan (COZAAR) 50 MG tablet TAKE 1 TABLET BY MOUTH EVERY DAY   Multiple Vitamin (MULTIVITAMIN) tablet Take 1 tablet by mouth daily.   [DISCONTINUED] citalopram (CELEXA) 20 MG tablet TAKE 1 TABLET BY MOUTH EVERYDAY AT BEDTIME   [EXPIRED] triamcinolone acetonide (KENALOG-40) injection 40 mg    No facility-administered encounter medications on file as of 05/26/2022.

## 2022-08-31 ENCOUNTER — Other Ambulatory Visit: Payer: Self-pay | Admitting: Family Medicine

## 2022-08-31 NOTE — Telephone Encounter (Signed)
Spoke to pt, scheduled cpe for 10/04/22

## 2022-08-31 NOTE — Telephone Encounter (Signed)
Lvm for patient tcb and schedule 

## 2022-08-31 NOTE — Telephone Encounter (Signed)
Please call and schedule CPE with fasting labs prior with Dr. Copland.  

## 2022-09-30 ENCOUNTER — Other Ambulatory Visit: Payer: Self-pay | Admitting: Family Medicine

## 2022-10-04 ENCOUNTER — Encounter: Payer: BLUE CROSS/BLUE SHIELD | Admitting: Family Medicine

## 2022-10-20 NOTE — Progress Notes (Unsigned)
Nathan Santos Seabrooks, MD, CAQ Sports Medicine [facility] at [facility] [address] [place], [address]  Phone: [phone]  FAX: [phone]  Nathan Santos - 57 y.o. male  MRN [id number]  Date of Birth: 1965/09/12  Date: 10/21/2022  PCP: Hannah Beat, MD  Referral: Hannah Beat, MD  No chief complaint on file.  Patient Care Team: Hannah Beat, MD as PCP - General Subjective:   Nathan Santos is a 57 y.o. pleasant patient who presents with the following:  Preventative Health Maintenance Visit:  Health Maintenance Summary Reviewed and updated, unless pt declines services.  Tobacco History Reviewed. Alcohol: No concerns, no excessive use Exercise Habits: Some activity, rec at least 30 mins 5 times a week STD concerns: no risk or activity to increase risk Drug Use: None  HIV Hep C Colon - 2016 colon with 5 year recall    Health Maintenance  Topic Date Due   COVID-19 Vaccine (1) Never done   HIV Screening  Never done   Hepatitis C Screening  Never done   COLONOSCOPY (Pts 45-31yrs Insurance coverage will need to be confirmed)  08/10/2019   INFLUENZA VACCINE  01/20/2023   DTaP/Tdap/Td (3 - Td or Tdap) 08/20/2031   Zoster Vaccines- Shingrix  Completed   HPV VACCINES  Aged Out   Immunization History  Administered Date(s) Administered   Influenza,inj,Quad PF,6+ Mos 07/01/2014   Influenza-Unspecified 04/16/2020, 04/03/2021, 03/22/2022   Td 11/20/2007   Tdap 08/19/2021   Zoster Recombinat (Shingrix) 08/19/2021, 10/29/2021   Patient Active Problem List   Diagnosis Date Noted   Essential hypertension, benign 05/22/2015    Priority: Medium    TOBACCO USE 04/24/2008    Priority: Medium    B12 deficiency 06/06/2015   Allergic conjunctivitis and rhinitis, bilateral 05/22/2015   Erectile dysfunction due to arterial insufficiency 07/02/2014    Past Medical History:  Diagnosis Date   Allergic rhinitis    Hypertension     Tobacco abuse     Past Surgical History:  Procedure Laterality Date   LAPAROSCOPIC CHOLECYSTECTOMY      Family History  Problem Relation Age of Onset   Heart disease Father    Colon cancer Neg Hx     Social History   Social History Narrative   REGULAR EXERCISE- YES    Past Medical History, Surgical History, Social History, Family History, Problem List, Medications, and Allergies have been reviewed and updated if relevant.  Review of Systems: Pertinent positives are listed above.  Otherwise, a full 14 point review of systems has been done in full and it is negative except where it is noted positive.  Objective:   There were no vitals taken for this visit. Ideal Body Weight:    Ideal Body Weight:   No results found.    08/19/2021    9:00 AM 05/07/2020    8:25 AM 06/23/2017    9:02 AM  Depression screen PHQ 2/9  Decreased Interest  0 0  Down, Depressed, Hopeless 1 0 0  PHQ - 2 Score 1 0 0     GEN: well developed, well nourished, no acute distress Eyes: conjunctiva and lids normal, PERRLA, EOMI ENT: TM clear, nares clear, oral exam WNL Neck: supple, no lymphadenopathy, no thyromegaly, no JVD Pulm: clear to auscultation and percussion, respiratory effort normal CV: regular rate and rhythm, S1-S2, no murmur, rub or gallop, no bruits, peripheral pulses normal and symmetric, no cyanosis, clubbing, edema or varicosities GI: soft, non-tender; no  hepatosplenomegaly, masses; active bowel sounds all quadrants GU: deferred Lymph: no cervical, axillary or inguinal adenopathy MSK: gait normal, muscle tone and strength WNL, no joint swelling, effusions, discoloration, crepitus  SKIN: clear, good turgor, color WNL, no rashes, lesions, or ulcerations Neuro: normal mental status, normal strength, sensation, and motion Psych: alert; oriented to person, place and time, normally interactive and not anxious or depressed in appearance.  All labs reviewed with patient. Results for orders  placed or performed in visit on 08/19/21  Basic metabolic panel  Result Value Ref Range   Sodium 136 135 - 145 mEq/L   Potassium 4.3 3.5 - 5.1 mEq/L   Chloride 100 96 - 112 mEq/L   CO2 29 19 - 32 mEq/L   Glucose, Bld 104 (H) 70 - 99 mg/dL   BUN 12 6 - 23 mg/dL   Creatinine, Ser 4.09 0.40 - 1.50 mg/dL   GFR 81.19 >14.78 mL/min   Calcium 9.4 8.4 - 10.5 mg/dL  CBC with Differential/Platelet  Result Value Ref Range   WBC 5.6 4.0 - 10.5 K/uL   RBC 5.03 4.22 - 5.81 Mil/uL   Hemoglobin 16.5 13.0 - 17.0 g/dL   HCT 29.5 62.1 - 30.8 %   MCV 95.8 78.0 - 100.0 fl   MCHC 34.2 30.0 - 36.0 g/dL   RDW 65.7 84.6 - 96.2 %   Platelets 199.0 150.0 - 400.0 K/uL   Neutrophils Relative % 62.8 43.0 - 77.0 %   Lymphocytes Relative 28.5 12.0 - 46.0 %   Monocytes Relative 6.8 3.0 - 12.0 %   Eosinophils Relative 1.5 0.0 - 5.0 %   Basophils Relative 0.4 0.0 - 3.0 %   Neutro Abs 3.5 1.4 - 7.7 K/uL   Lymphs Abs 1.6 0.7 - 4.0 K/uL   Monocytes Absolute 0.4 0.1 - 1.0 K/uL   Eosinophils Absolute 0.1 0.0 - 0.7 K/uL   Basophils Absolute 0.0 0.0 - 0.1 K/uL  Hepatic function panel  Result Value Ref Range   Total Bilirubin 0.5 0.2 - 1.2 mg/dL   Bilirubin, Direct 0.1 0.0 - 0.3 mg/dL   Alkaline Phosphatase 78 39 - 117 U/L   AST 15 0 - 37 U/L   ALT 20 0 - 53 U/L   Total Protein 6.9 6.0 - 8.3 g/dL   Albumin 4.3 3.5 - 5.2 g/dL  Hemoglobin X5M  Result Value Ref Range   Hgb A1c MFr Bld 5.8 4.6 - 6.5 %  Lipid panel  Result Value Ref Range   Cholesterol 197 0 - 200 mg/dL   Triglycerides 841.3 0.0 - 149.0 mg/dL   HDL 24.40 >10.27 mg/dL   VLDL 25.3 0.0 - 66.4 mg/dL   LDL Cholesterol 403 (H) 0 - 99 mg/dL   Total CHOL/HDL Ratio 4    NonHDL 141.80   Vitamin B12  Result Value Ref Range   Vitamin B-12 218 211 - 911 pg/mL    Assessment and Plan:     ICD-10-CM   1. Healthcare maintenance  Z00.00       Health Maintenance Exam: The patient's preventative maintenance and recommended screening tests for an annual  wellness exam were reviewed in full today. Brought up to date unless services declined.  Counselled on the importance of diet, exercise, and its role in overall health and mortality. The patient's FH and SH was reviewed, including their home life, tobacco status, and drug and alcohol status.  Follow-up in 1 year for physical exam or additional follow-up below.  Disposition: No follow-ups on  file.  No orders of the defined types were placed in this encounter.  There are no discontinued medications. No orders of the defined types were placed in this encounter.   Signed,  Elpidio Galea. Aryani Daffern, MD   Allergies as of 10/21/2022   No Known Allergies      Medication List        Accurate as of Oct 20, 2022 12:25 PM. If you have any questions, ask your nurse or doctor.          hydrochlorothiazide 12.5 MG capsule Commonly known as: MICROZIDE TAKE 1 CAPSULE BY MOUTH EVERY DAY   losartan 50 MG tablet Commonly known as: COZAAR TAKE 1 TABLET BY MOUTH EVERY DAY   multivitamin tablet Take 1 tablet by mouth daily.

## 2022-10-21 ENCOUNTER — Encounter: Payer: Self-pay | Admitting: Family Medicine

## 2022-10-21 ENCOUNTER — Ambulatory Visit (INDEPENDENT_AMBULATORY_CARE_PROVIDER_SITE_OTHER): Payer: BLUE CROSS/BLUE SHIELD | Admitting: Family Medicine

## 2022-10-21 ENCOUNTER — Encounter: Payer: Self-pay | Admitting: *Deleted

## 2022-10-21 VITALS — BP 132/82 | HR 63 | Temp 97.3°F | Ht 70.0 in | Wt 262.2 lb

## 2022-10-21 DIAGNOSIS — Z125 Encounter for screening for malignant neoplasm of prostate: Secondary | ICD-10-CM

## 2022-10-21 DIAGNOSIS — Z1159 Encounter for screening for other viral diseases: Secondary | ICD-10-CM

## 2022-10-21 DIAGNOSIS — M2341 Loose body in knee, right knee: Secondary | ICD-10-CM | POA: Diagnosis not present

## 2022-10-21 DIAGNOSIS — M25561 Pain in right knee: Secondary | ICD-10-CM

## 2022-10-21 DIAGNOSIS — Z79899 Other long term (current) drug therapy: Secondary | ICD-10-CM

## 2022-10-21 DIAGNOSIS — Z13220 Encounter for screening for lipoid disorders: Secondary | ICD-10-CM

## 2022-10-21 DIAGNOSIS — Z1211 Encounter for screening for malignant neoplasm of colon: Secondary | ICD-10-CM

## 2022-10-21 DIAGNOSIS — Z131 Encounter for screening for diabetes mellitus: Secondary | ICD-10-CM | POA: Diagnosis not present

## 2022-10-21 DIAGNOSIS — Z114 Encounter for screening for human immunodeficiency virus [HIV]: Secondary | ICD-10-CM

## 2022-10-21 DIAGNOSIS — Z0000 Encounter for general adult medical examination without abnormal findings: Secondary | ICD-10-CM

## 2022-10-21 LAB — HEPATIC FUNCTION PANEL
ALT: 21 U/L (ref 0–53)
AST: 15 U/L (ref 0–37)
Albumin: 3.9 g/dL (ref 3.5–5.2)
Alkaline Phosphatase: 57 U/L (ref 39–117)
Bilirubin, Direct: 0.1 mg/dL (ref 0.0–0.3)
Total Bilirubin: 0.4 mg/dL (ref 0.2–1.2)
Total Protein: 6.6 g/dL (ref 6.0–8.3)

## 2022-10-21 LAB — BASIC METABOLIC PANEL
BUN: 13 mg/dL (ref 6–23)
CO2: 30 mEq/L (ref 19–32)
Calcium: 9.1 mg/dL (ref 8.4–10.5)
Chloride: 102 mEq/L (ref 96–112)
Creatinine, Ser: 0.73 mg/dL (ref 0.40–1.50)
GFR: 100.27 mL/min (ref >60.00)
Glucose, Bld: 103 mg/dL — ABNORMAL HIGH (ref 70–99)
Potassium: 4.1 mEq/L (ref 3.5–5.1)
Sodium: 139 mEq/L (ref 135–145)

## 2022-10-21 LAB — LIPID PANEL
Cholesterol: 147 mg/dL (ref 0–200)
HDL: 47.8 mg/dL (ref >39.00)
LDL Cholesterol: 87 mg/dL (ref 0–99)
NonHDL: 98.99
Total CHOL/HDL Ratio: 3
Triglycerides: 61 mg/dL (ref 0.0–149.0)
VLDL: 12.2 mg/dL (ref 0.0–40.0)

## 2022-10-21 LAB — CBC WITH DIFFERENTIAL/PLATELET
Basophils Absolute: 0 10*3/uL (ref 0.0–0.1)
Basophils Relative: 0.4 % (ref 0.0–3.0)
Eosinophils Absolute: 0.1 10*3/uL (ref 0.0–0.7)
Eosinophils Relative: 1.6 % (ref 0.0–5.0)
HCT: 48.3 % (ref 39.0–52.0)
Hemoglobin: 16.3 g/dL (ref 13.0–17.0)
Lymphocytes Relative: 23.9 % (ref 12.0–46.0)
Lymphs Abs: 1.5 10*3/uL (ref 0.7–4.0)
MCHC: 33.8 g/dL (ref 30.0–36.0)
MCV: 95 fl (ref 78.0–100.0)
Monocytes Absolute: 0.5 10*3/uL (ref 0.1–1.0)
Monocytes Relative: 7.3 % (ref 3.0–12.0)
Neutro Abs: 4.1 10*3/uL (ref 1.4–7.7)
Neutrophils Relative %: 66.8 % (ref 43.0–77.0)
Platelets: 222 10*3/uL (ref 150.0–400.0)
RBC: 5.09 Mil/uL (ref 4.22–5.81)
RDW: 13.9 % (ref 11.5–15.5)
WBC: 6.1 10*3/uL (ref 4.0–10.5)

## 2022-10-21 LAB — HEMOGLOBIN A1C: Hgb A1c MFr Bld: 5.8 % (ref 4.6–6.5)

## 2022-10-22 ENCOUNTER — Telehealth: Payer: Self-pay | Admitting: Family Medicine

## 2022-10-22 LAB — HIV ANTIBODY (ROUTINE TESTING W REFLEX): HIV 1&2 Ab, 4th Generation: NONREACTIVE

## 2022-10-22 LAB — HEPATITIS C ANTIBODY: Hepatitis C Ab: NONREACTIVE

## 2022-10-22 LAB — PSA, TOTAL WITH REFLEX TO PSA, FREE: PSA, Total: 0.6 ng/mL (ref <=4.0)

## 2022-10-22 NOTE — Telephone Encounter (Signed)
Pt called in requesting proof of his physical be fax to his employer  [employer] Fax # [phone] Att . Caryl Never

## 2022-10-27 ENCOUNTER — Encounter: Payer: Self-pay | Admitting: Family Medicine

## 2022-10-27 NOTE — Telephone Encounter (Signed)
Letter stating patient has his CPE on 10/21/2022, with Dr. Patsy Lager, was faxed to Caryl Never at [phone].

## 2022-10-28 ENCOUNTER — Ambulatory Visit: Payer: BLUE CROSS/BLUE SHIELD | Admitting: Family Medicine

## 2022-10-28 ENCOUNTER — Encounter: Payer: Self-pay | Admitting: Family Medicine

## 2022-10-28 VITALS — BP 140/80 | HR 72 | Temp 98.2°F | Ht 70.0 in | Wt 265.4 lb

## 2022-10-28 DIAGNOSIS — M25561 Pain in right knee: Secondary | ICD-10-CM

## 2022-10-28 DIAGNOSIS — M2341 Loose body in knee, right knee: Secondary | ICD-10-CM | POA: Diagnosis not present

## 2022-10-28 MED ORDER — HYDROCODONE-ACETAMINOPHEN 5-325 MG PO TABS: 1.0000 | ORAL_TABLET | Freq: Two times a day (BID) | ORAL | 0 refills | Status: DC | PRN

## 2022-10-28 MED ORDER — DICLOFENAC SODIUM 75 MG PO TBEC: 75.0000 mg | DELAYED_RELEASE_TABLET | Freq: Two times a day (BID) | ORAL | 0 refills | Status: DC

## 2022-10-28 NOTE — Assessment & Plan Note (Addendum)
Acute, worsening pain He has been unable to do his job functions including squatting walking climbing.  He has been out of work. I think it is reasonable to take him out of work given his inability to perform his job functions until the date of the MRI of this knee was 1 day for PCP to review results and set out a plan.. No sign of infection.  Poor pain control, overusing Advil.  Stop Advil and changed to diclofenac 75 mg p.o. twice daily (he can change back to Advil 800 mg 3 times daily only if diclofenac not helping more with pain and inflammation) will provide a prescription for hydrocodone to use 1 to 2 tablets daily as needed for breakthrough pain.  We discussed limiting this use.  PDMP reviewed during this encounter.  He will continue elevating, icing and wearing the brace.

## 2022-10-28 NOTE — Progress Notes (Signed)
Patient ID: Nathan Santos, male    DOB: 05/25/1966, 57 y.o.   MRN: [id number]  This visit was conducted in person.  BP (!) 140/80 (BP Location: Left Arm, Patient Position: Sitting, Cuff Size: Large)   Pulse 72   Temp 98.2 F (36.8 C) (Temporal)   Ht 5\' 10"  (1.778 m)   Wt 265 lb 6 oz (120.4 kg)   SpO2 98%   BMI 38.08 kg/m    CC:  Chief Complaint  Patient presents with   Knee Pain    MRI scheduled for Monday 11/01/2022 Seen Dr. Patsy Lager on 10/21/2022 for CPE    Subjective:   HPI: Nathan Santos is a 57 y.o. male patient of Dr. Cyndie Chime with history of hypertension and tobacco abuse presenting on 10/28/2022 for Knee Pain (MRI scheduled for Monday 11/01/2022/Seen Dr. Patsy Lager on 10/21/2022 for CPE)   Acute on chronic right knee pain Reviewed recent office visit from May 2 with PCP for similar issue. Few weeks ago twisted his knee, now with right lateral knee pain. Noted need to manually move foreign/loose body sensation to alleviate pain. Reviewed May 28, 2022 weightbearing knee films showed possible multiple loose bodies in the knee, osteoarthritis changes, soft tissue swelling and joint effusion He has been scheduled for an MRI of the right knee May 13.  He has been using Advil 800 mg  every 6 hours and topical diclofenac gell several times  a day  as needed for pain.  He has been unable to go to work this week. Pain in knee is worsening.  Increased pain with walking.  Notes large loose body popping out medically and needs to move it back.  Swelling off and on.  No redness and heat. No fever.  Using knee braces.  He clibs at work, bending on knees.   Relevant past medical, surgical, family and social history reviewed and updated as indicated. Interim medical history since our last visit reviewed. Allergies and medications reviewed and updated. Outpatient Medications Prior to Visit  Medication Sig Dispense Refill   hydrochlorothiazide (MICROZIDE) 12.5 MG capsule  TAKE 1 CAPSULE BY MOUTH EVERY DAY 90 capsule 0   losartan (COZAAR) 50 MG tablet TAKE 1 TABLET BY MOUTH EVERY DAY 90 tablet 0   Multiple Vitamin (MULTIVITAMIN) tablet Take 1 tablet by mouth daily.     No facility-administered medications prior to visit.     Per HPI unless specifically indicated in ROS section below Review of Systems  Constitutional:  Negative for fatigue and fever.  HENT:  Negative for ear pain.   Eyes:  Negative for pain.  Respiratory:  Negative for cough and shortness of breath.   Cardiovascular:  Negative for chest pain, palpitations and leg swelling.  Gastrointestinal:  Negative for abdominal pain.  Genitourinary:  Negative for dysuria.  Musculoskeletal:  Positive for arthralgias and gait problem.  Neurological:  Negative for syncope, light-headedness and headaches.  Psychiatric/Behavioral:  Negative for dysphoric mood.    Objective:  BP (!) 140/80 (BP Location: Left Arm, Patient Position: Sitting, Cuff Size: Large)   Pulse 72   Temp 98.2 F (36.8 C) (Temporal)   Ht 5\' 10"  (1.778 m)   Wt 265 lb 6 oz (120.4 kg)   SpO2 98%   BMI 38.08 kg/m   Wt Readings from Last 3 Encounters:  10/28/22 265 lb 6 oz (120.4 kg)  10/21/22 262 lb 4 oz (119 kg)  05/26/22 295 lb (133.8 kg)      Physical Exam  Constitutional:      Appearance: He is well-developed.  HENT:     Head: Normocephalic.     Right Ear: Hearing normal.     Left Ear: Hearing normal.     Nose: Nose normal.  Neck:     Thyroid: No thyroid mass or thyromegaly.     Vascular: No carotid bruit.     Trachea: Trachea normal.  Cardiovascular:     Rate and Rhythm: Normal rate and regular rhythm.     Pulses: Normal pulses.     Heart sounds: Heart sounds not distant. No murmur heard.    No friction rub. No gallop.     Comments: No peripheral edema Pulmonary:     Effort: Pulmonary effort is normal. No respiratory distress.     Breath sounds: Normal breath sounds.  Musculoskeletal:     Right knee: No  swelling, deformity, effusion, erythema, bony tenderness or crepitus. Decreased range of motion. Tenderness present over the medial joint line and lateral joint line. Abnormal meniscus.     Instability Tests: Anterior drawer test negative. Posterior drawer test negative. Anterior Lachman test negative.  Skin:    General: Skin is warm and dry.     Findings: No rash.  Psychiatric:        Speech: Speech normal.        Behavior: Behavior normal.        Thought Content: Thought content normal.       Results for orders placed or performed in visit on 10/21/22  Basic metabolic panel  Result Value Ref Range   Sodium 139 135 - 145 mEq/L   Potassium 4.1 3.5 - 5.1 mEq/L   Chloride 102 96 - 112 mEq/L   CO2 30 19 - 32 mEq/L   Glucose, Bld 103 (H) 70 - 99 mg/dL   BUN 13 6 - 23 mg/dL   Creatinine, Ser 5.40 0.40 - 1.50 mg/dL   GFR 981.19 >14.78 mL/min   Calcium 9.1 8.4 - 10.5 mg/dL  CBC with Differential/Platelet  Result Value Ref Range   WBC 6.1 4.0 - 10.5 K/uL   RBC 5.09 4.22 - 5.81 Mil/uL   Hemoglobin 16.3 13.0 - 17.0 g/dL   HCT 29.5 62.1 - 30.8 %   MCV 95.0 78.0 - 100.0 fl   MCHC 33.8 30.0 - 36.0 g/dL   RDW 65.7 84.6 - 96.2 %   Platelets 222.0 150.0 - 400.0 K/uL   Neutrophils Relative % 66.8 43.0 - 77.0 %   Lymphocytes Relative 23.9 12.0 - 46.0 %   Monocytes Relative 7.3 3.0 - 12.0 %   Eosinophils Relative 1.6 0.0 - 5.0 %   Basophils Relative 0.4 0.0 - 3.0 %   Neutro Abs 4.1 1.4 - 7.7 K/uL   Lymphs Abs 1.5 0.7 - 4.0 K/uL   Monocytes Absolute 0.5 0.1 - 1.0 K/uL   Eosinophils Absolute 0.1 0.0 - 0.7 K/uL   Basophils Absolute 0.0 0.0 - 0.1 K/uL  Hepatic function panel  Result Value Ref Range   Total Bilirubin 0.4 0.2 - 1.2 mg/dL   Bilirubin, Direct 0.1 0.0 - 0.3 mg/dL   Alkaline Phosphatase 57 39 - 117 U/L   AST 15 0 - 37 U/L   ALT 21 0 - 53 U/L   Total Protein 6.6 6.0 - 8.3 g/dL   Albumin 3.9 3.5 - 5.2 g/dL  Hemoglobin X5M  Result Value Ref Range   Hgb A1c MFr Bld 5.8 4.6 -  6.5 %  Lipid panel  Result Value Ref Range   Cholesterol 147 0 - 200 mg/dL   Triglycerides 16.1 0.0 - 149.0 mg/dL   HDL 09.60 >45.40 mg/dL   VLDL 98.1 0.0 - 19.1 mg/dL   LDL Cholesterol 87 0 - 99 mg/dL   Total CHOL/HDL Ratio 3    NonHDL 98.99   PSA, Total with Reflex to PSA, Free  Result Value Ref Range   PSA, Total 0.6 < OR = 4.0 ng/mL  HIV Antibody (routine testing w rflx)  Result Value Ref Range   HIV 1&2 Ab, 4th Generation NON-REACTIVE NON-REACTIVE  Hepatitis C antibody  Result Value Ref Range   Hepatitis C Ab NON-REACTIVE NON-REACTIVE    Assessment and Plan  Bodies, loose, joint, knee, right  Acute pain of right knee Assessment & Plan: Acute, worsening pain He has been unable to do his job functions including squatting walking climbing.  He has been out of work. I think it is reasonable to take him out of work given his inability to perform his job functions until the date of the MRI of this knee was 1 day for PCP to review results and set out a plan.. No sign of infection.  Poor pain control, overusing Advil.  Stop Advil and changed to diclofenac 75 mg p.o. twice daily (he can change back to Advil 800 mg 3 times daily only if diclofenac not helping more with pain and inflammation) will provide a prescription for hydrocodone to use 1 to 2 tablets daily as needed for breakthrough pain.  We discussed limiting this use.  PDMP reviewed during this encounter.  He will continue elevating, icing and wearing the brace.   Other orders -     HYDROcodone-Acetaminophen; Take 1 tablet by mouth every 12 (twelve) hours as needed for moderate pain.  Dispense: 10 tablet; Refill: 0 -     Diclofenac Sodium; Take 1 tablet (75 mg total) by mouth 2 (two) times daily.  Dispense: 30 tablet; Refill: 0    No follow-ups on file.   Kerby Nora, MD

## 2022-10-28 NOTE — Patient Instructions (Addendum)
Stop advil. DO not take more than 800 mg three times daily.  Change to diclofenac 75 mg twice daily.  Can use hydrocodone  for breakthrough pain.Marland Kitchen limit use.

## 2022-11-01 ENCOUNTER — Ambulatory Visit
Admission: RE | Admit: 2022-11-01 | Discharge: 2022-11-01 | Disposition: A | Discharge status: Home / Self Care | Payer: BLUE CROSS/BLUE SHIELD | Source: Ambulatory Visit | Attending: Family Medicine | Admitting: Family Medicine

## 2022-11-01 DIAGNOSIS — M2341 Loose body in knee, right knee: Secondary | ICD-10-CM

## 2022-11-01 DIAGNOSIS — M25561 Pain in right knee: Secondary | ICD-10-CM

## 2022-11-03 ENCOUNTER — Telehealth: Payer: Self-pay | Admitting: Family Medicine

## 2022-11-03 DIAGNOSIS — M25561 Pain in right knee: Secondary | ICD-10-CM

## 2022-11-03 DIAGNOSIS — M2341 Loose body in knee, right knee: Secondary | ICD-10-CM

## 2022-11-03 DIAGNOSIS — M1711 Unilateral primary osteoarthritis, right knee: Secondary | ICD-10-CM

## 2022-11-03 NOTE — Addendum Note (Signed)
Addended by: Damita Lack on: 11/03/2022 11:47 AM   Modules accepted: Orders

## 2022-11-03 NOTE — Telephone Encounter (Signed)
His MRI has not been read by Radiology.  We will contact him to discuss when it has been read.   He has missed work today.  Does he think that he can go back tomorrow?  I need some guidance, since Dr. Ermalene Searing was the MD who wrote him out of work.  I am ok if he does not think he can go back to work until Monday, too.

## 2022-11-03 NOTE — Telephone Encounter (Signed)
Patient called in and stated that he hasn't heard anything in regards to his MRI. He stated that he wasn't able to go back to work today and was wanting to know if his letter could be extended due him missing work today. He stated that he has been out of work since May 6th. It can be sent over to [employer] Fax: [phone] Attn: Caryl Never. Thank you!

## 2022-11-03 NOTE — Telephone Encounter (Signed)
Spoke with Mr. Nathan Santos and advised MRI results are still pending.  Per Dr. Patsy Lager work note was written for patient to remain out of work until 11/08/2022 pending MRI results.   Letter faxed to Nathan Santos at [phone].    While on the phone patient as if he could get a refill on the pain medication.  He states he ran out on Tuesday.   Last filled 050/02/2023 for #10 with no refills.   Pharmacy:  [facility] [address].

## 2022-11-08 MED ORDER — HYDROCODONE-ACETAMINOPHEN 5-325 MG PO TABS: 1.0000 | ORAL_TABLET | Freq: Two times a day (BID) | ORAL | 0 refills | Status: DC | PRN

## 2022-11-08 NOTE — Addendum Note (Signed)
Addended by: Hannah Beat on: 11/08/2022 10:28 AM   Modules accepted: Orders

## 2022-11-08 NOTE — Telephone Encounter (Signed)
Done and referred.

## 2022-11-08 NOTE — Telephone Encounter (Signed)
Patient called in and stated that he spoke with someone in regards to his results. He stated that he would like for his referral to an ortho surgeon to be sent to an office in [place]. Thank you!

## 2022-11-11 ENCOUNTER — Encounter: Payer: Self-pay | Admitting: *Deleted

## 2022-11-18 ENCOUNTER — Telehealth: Payer: Self-pay

## 2022-11-18 NOTE — Telephone Encounter (Signed)
PA initiated via Covermymeds; KEY: [id number]. Awaiting determination.

## 2022-11-23 ENCOUNTER — Other Ambulatory Visit: Payer: Self-pay | Admitting: Family Medicine

## 2022-11-23 MED ORDER — HYDROCODONE-ACETAMINOPHEN 5-325 MG PO TABS: 1.0000 | ORAL_TABLET | Freq: Two times a day (BID) | ORAL | 0 refills | Status: DC | PRN

## 2022-11-23 NOTE — Telephone Encounter (Signed)
My chart sent to patient to let know approved  ? ?

## 2022-11-23 NOTE — Telephone Encounter (Signed)
PA approved.   Your request has been approved Authorization Expiration Date: 12/03/2022

## 2022-12-01 ENCOUNTER — Other Ambulatory Visit: Payer: Self-pay | Admitting: Family Medicine

## 2022-12-01 NOTE — Progress Notes (Signed)
Nathan Crill T. Naoma Boxell, MD, CAQ Sports Medicine [facility] at [facility] [address] [place], [address]  Phone: [phone]  FAX: [phone]  Nathan Santos - 57 y.o. male  MRN [id number]  Date of Birth: April 15, 1966  Date: 12/02/2022  PCP: Hannah Beat, MD  Referral: Hannah Beat, MD  Chief Complaint  Patient presents with   Knee Pain    Right-Wants to discuss Ortho Recommendations   Subjective:   Nathan Santos is a 57 y.o. very pleasant male patient with Body mass index is 37.07 kg/m. who presents with the following:  Patient presents with ongoing right-sided knee pain.  We have previously seen him for this, and actually got an MRI of his knee already.  He did have tricompartmental arthritis most severe in the medial compartment and patellofemoral compartment.  He also had multiple loose bodies. The largest is 2x1x1 cm.  They are easily visualized on x-ray and MRI.  There was also a medial meniscal tear.  I referred him to orthopedics several weeks ago.  Ortho: wanted to start him in physical therapy with follow-up in July. They recommended that he stay out of work for 2 additional months.  Fallen several times in the last month.  Feels as if he can feel the loose bodies, and his leg will give out.     Review of Systems is noted in the HPI, as appropriate  Objective:   BP (!) 142/80 (BP Location: Left Arm, Patient Position: Sitting, Cuff Size: Large)   Pulse 64   Temp 98 F (36.7 C) (Temporal)   Ht 5\' 10"  (1.778 m)   Wt 258 lb 6 oz (117.2 kg)   SpO2 96%   BMI 37.07 kg/m   GEN: No acute distress; alert,appropriate. PULM: Breathing comfortably in no respiratory distress PSYCH: Normally interactive.   Right knee: He lacks 2 degrees of extension.  Flexion to 100.  Minimal pain with manipulation of the kneecap.  Mild pain with loading the medial lateral patellar facets Medial greater than lateral joint line  tenderness Stable to varus and valgus stress Lachman and drawer testing is negative He does have any kind of pain with forced flexion, McMurray's, and bounce home testing.  Laboratory and Imaging Data:  Assessment and Plan:     ICD-10-CM   1. Primary osteoarthritis of right knee  M17.11 Ambulatory referral to Orthopedic Surgery    2. Bodies, loose, joint, knee, right  M23.41 Ambulatory referral to Orthopedic Surgery    3. Acute medial meniscal tear, right, initial encounter  S83.241A Ambulatory referral to Orthopedic Surgery     While he does have arthritis, this severe, impairing knee pain is only been going on for about 6 weeks or so.  In addition to his arthritis, he also has some very prominent loose bodies and as well as some meniscal tearing.  I wonder if the loose bodies are causing his knee to feel as if it is getting stuck in various places and having mechanical symptoms.  He and I both think it be reasonable to get a second opinion in this case.  I am going to see if Dr. Roda Shutters will offer his opinion and expertise on this case.  I appreciate his care and opinion with this very pleasant patient.  Orders placed today for conditions managed today: Orders Placed This Encounter  Procedures   Ambulatory referral to Orthopedic Surgery    Disposition: No follow-ups on file.  [employer] One speech-to-text software  was used for transcription in this dictation.  Possible transcriptional errors can occur using Animal nutritionist.   Signed,  Elpidio Galea. Quinlin Conant, MD   Outpatient Encounter Medications as of 12/02/2022  Medication Sig   diclofenac (VOLTAREN) 75 MG EC tablet Take 1 tablet (75 mg total) by mouth 2 (two) times daily.   hydrochlorothiazide (MICROZIDE) 12.5 MG capsule TAKE 1 CAPSULE BY MOUTH EVERY DAY   HYDROcodone-acetaminophen (NORCO/VICODIN) 5-325 MG tablet Take 1 tablet by mouth every 12 (twelve) hours as needed for moderate pain.   losartan (COZAAR) 50 MG tablet TAKE  1 TABLET BY MOUTH EVERY DAY   Multiple Vitamin (MULTIVITAMIN) tablet Take 1 tablet by mouth daily.   No facility-administered encounter medications on file as of 12/02/2022.

## 2022-12-02 ENCOUNTER — Encounter: Payer: Self-pay | Admitting: Family Medicine

## 2022-12-02 ENCOUNTER — Ambulatory Visit: Payer: BLUE CROSS/BLUE SHIELD | Admitting: Family Medicine

## 2022-12-02 VITALS — BP 142/80 | HR 64 | Temp 98.0°F | Ht 70.0 in | Wt 258.4 lb

## 2022-12-02 DIAGNOSIS — S83241A Other tear of medial meniscus, current injury, right knee, initial encounter: Secondary | ICD-10-CM | POA: Diagnosis not present

## 2022-12-02 DIAGNOSIS — M1711 Unilateral primary osteoarthritis, right knee: Secondary | ICD-10-CM

## 2022-12-02 DIAGNOSIS — M2341 Loose body in knee, right knee: Secondary | ICD-10-CM

## 2022-12-07 ENCOUNTER — Ambulatory Visit: Payer: BLUE CROSS/BLUE SHIELD | Admitting: Orthopaedic Surgery

## 2022-12-07 ENCOUNTER — Other Ambulatory Visit: Payer: Self-pay | Admitting: Family Medicine

## 2022-12-07 VITALS — Ht 70.0 in | Wt 250.0 lb

## 2022-12-07 DIAGNOSIS — M2341 Loose body in knee, right knee: Secondary | ICD-10-CM | POA: Diagnosis not present

## 2022-12-07 DIAGNOSIS — M1711 Unilateral primary osteoarthritis, right knee: Secondary | ICD-10-CM | POA: Diagnosis not present

## 2022-12-07 MED ORDER — HYDROCODONE-ACETAMINOPHEN 5-325 MG PO TABS: 1.0000 | ORAL_TABLET | Freq: Two times a day (BID) | ORAL | 0 refills | Status: DC | PRN

## 2022-12-07 NOTE — Progress Notes (Signed)
Office Visit Note   Patient: Nathan Santos           Date of Birth: 1966-06-11           MRN: [id number] Visit Date: 12/07/2022              Requested by: Hannah Beat, MD [address] [place],  [place] [address] PCP: Hannah Beat, MD   Assessment & Plan: Visit Diagnoses:  1. Bodies, loose, joint, knee, right   2. Primary osteoarthritis of right knee     Plan: Impression is 57 year old gentleman with symptomatic loose bodies in the knee joint.  MRI reviewed with the patient and he does have sizable loose bodies.  His symptoms are correlated to mechanical symptoms related to these.  Ultimately he understands he would likely need a knee replacement for the amount of DJD but for now we will stick to the knee scope as a way to improve his symptoms from the loose bodies.  Will be in touch with the patient in the near future.  Follow-Up Instructions: No follow-ups on file.   Orders:  No orders of the defined types were placed in this encounter.  No orders of the defined types were placed in this encounter.     Procedures: No procedures performed   Clinical Data: No additional findings.   Subjective: Chief Complaint  Patient presents with   Right Knee - Pain    HPI Patient is a 57 year old gentleman here for chronic right knee pain for 5 years with good and bad days.  Denies any injuries.  Has had multiple steroid injections that were initially very helpful.  Recently he had injury in which his knee popped and since then he has had frequent mechanical symptoms of giving way and locking and he feels loose body sensation.  He had an MRI last month which confirmed DJD as well as multiple large loose bodies. Review of Systems  Constitutional: Negative.   HENT: Negative.    Eyes: Negative.   Respiratory: Negative.    Cardiovascular: Negative.   Gastrointestinal: Negative.   Endocrine: Negative.   Genitourinary: Negative.   Skin: Negative.    Allergic/Immunologic: Negative.   Neurological: Negative.   Hematological: Negative.   Psychiatric/Behavioral: Negative.    All other systems reviewed and are negative.    Objective: Vital Signs: Ht 5\' 10"  (1.778 m)   Wt 250 lb (113.4 kg)   BMI 35.87 kg/m   Physical Exam Vitals and nursing note reviewed.  Constitutional:      Appearance: He is well-developed.  HENT:     Head: Normocephalic and atraumatic.  Eyes:     Pupils: Pupils are equal, round, and reactive to light.  Pulmonary:     Effort: Pulmonary effort is normal.  Abdominal:     Palpations: Abdomen is soft.  Musculoskeletal:        General: Normal range of motion.     Cervical back: Neck supple.  Skin:    General: Skin is warm.  Neurological:     Mental Status: He is alert and oriented to person, place, and time.  Psychiatric:        Behavior: Behavior normal.        Thought Content: Thought content normal.        Judgment: Judgment normal.     Ortho Exam Examination of the right knee shows trace effusion.  I can feel a palpable loose body in the lateral gutter.  He has pain  to range of motion.  Collaterals and cruciates are stable. Specialty Comments:  No specialty comments available.  Imaging: No results found.   PMFS History: Patient Active Problem List   Diagnosis Date Noted   Bodies, loose, joint, knee, right 10/28/2022   B12 deficiency 06/06/2015   Essential hypertension, benign 05/22/2015   Allergic conjunctivitis and rhinitis, bilateral 05/22/2015   Erectile dysfunction due to arterial insufficiency 07/02/2014   Acute pain of right knee 12/07/2013   TOBACCO USE 04/24/2008   Past Medical History:  Diagnosis Date   Allergic rhinitis    Hypertension    Tobacco abuse     Family History  Problem Relation Age of Onset   Heart disease Father    Colon cancer Neg Hx     Past Surgical History:  Procedure Laterality Date   LAPAROSCOPIC CHOLECYSTECTOMY     Social History    Occupational History   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

## 2022-12-07 NOTE — Telephone Encounter (Signed)
Last office visit 12/02/2022 for knee pain.  Last refilled 11/23/2022 for #30 with no refills.  Next Appt: No future appointments with PCP.

## 2022-12-22 ENCOUNTER — Other Ambulatory Visit: Payer: Self-pay | Admitting: Family Medicine

## 2022-12-22 MED ORDER — HYDROCODONE-ACETAMINOPHEN 5-325 MG PO TABS: 1.0000 | ORAL_TABLET | Freq: Two times a day (BID) | ORAL | 0 refills | Status: DC | PRN

## 2022-12-22 NOTE — Telephone Encounter (Signed)
Last office visit 12/02/2022 for knee pain.  Last refilled 12/07/2022 for #30 with no refills.  Next Appt: No future appointments with PCP.

## 2022-12-22 NOTE — Telephone Encounter (Signed)
I can fill his pain medication again, but he will need to get Dr. Roda Shutters and his team to manage his post-operative pain.

## 2023-01-04 ENCOUNTER — Other Ambulatory Visit: Payer: Self-pay | Admitting: Physician Assistant

## 2023-01-04 MED ORDER — HYDROCODONE-ACETAMINOPHEN 5-325 MG PO TABS: 1.0000 | ORAL_TABLET | Freq: Three times a day (TID) | ORAL | 0 refills | Status: DC | PRN

## 2023-01-04 MED ORDER — ONDANSETRON HCL 4 MG PO TABS: 4.0000 mg | ORAL_TABLET | Freq: Three times a day (TID) | ORAL | 0 refills | Status: DC | PRN

## 2023-01-06 ENCOUNTER — Encounter: Payer: Self-pay | Admitting: Orthopaedic Surgery

## 2023-01-06 ENCOUNTER — Telehealth: Payer: Self-pay | Admitting: Orthopaedic Surgery

## 2023-01-06 NOTE — Telephone Encounter (Signed)
Patient's son called. He medication has not been called in. Would like it called in to [facility].

## 2023-01-07 NOTE — Telephone Encounter (Signed)
I called patient and advised. 

## 2023-01-07 NOTE — Telephone Encounter (Signed)
I called patient to advise Nathan Santos sent in Hydrocodone and Zofran on 01/04/2023.  He states that he got those medications, however, after he left the hospital yesterday, he had discharge instructions to take Oxycodone, Tylenol, Robaxin and Toradol. He did not get these medications. Please advise whether he should be taking them, and if so, please send to [facility]  [address] [place].

## 2023-01-07 NOTE — Telephone Encounter (Signed)
He should take the hydrocodone that lindsey prescribed.  Thanks.

## 2023-01-13 ENCOUNTER — Ambulatory Visit (INDEPENDENT_AMBULATORY_CARE_PROVIDER_SITE_OTHER): Payer: BLUE CROSS/BLUE SHIELD | Admitting: Physician Assistant

## 2023-01-13 DIAGNOSIS — Z9889 Other specified postprocedural states: Secondary | ICD-10-CM

## 2023-01-13 NOTE — Progress Notes (Signed)
   Post-Op Visit Note   Patient: Nathan Santos           Date of Birth: Nov 29, 1965           MRN: [id number] Visit Date: 01/13/2023 PCP: Hannah Beat, MD   Assessment & Plan:  Chief Complaint:  Chief Complaint  Patient presents with   Right Knee - Routine Post Op   Visit Diagnoses:  1. S/P right knee arthroscopy     Plan: Patient is a pleasant 57 year old gentleman who comes in today 1 week status post right knee arthroscopic removal loose body 01/06/2023.  He is feeling much better.  Examination of the right knee reveals fully healed surgical portals with nylon sutures in place.  No evidence of infection or cellulitis.  He does have a small knee effusion.  Calf is soft nontender.  He is neurovascularly intact distally.  Today, sutures were removed and Steri-Strips applied.  Intraoperative pictures reviewed.  He will continue to advance with activity as tolerated.  We have provided him with a work note to return 02/01/2023 without restrictions.  Should he need this addended he will let us know.  Follow-up in 5 weeks for recheck.  Call with concerns or questions.  Follow-Up Instructions: Return in about 5 weeks (around 02/17/2023).   Orders:  No orders of the defined types were placed in this encounter.  No orders of the defined types were placed in this encounter.   Imaging: No new imaging  PMFS History: Patient Active Problem List   Diagnosis Date Noted   Bodies, loose, joint, knee, right 10/28/2022   B12 deficiency 06/06/2015   Essential hypertension, benign 05/22/2015   Allergic conjunctivitis and rhinitis, bilateral 05/22/2015   Erectile dysfunction due to arterial insufficiency 07/02/2014   Acute pain of right knee 12/07/2013   TOBACCO USE 04/24/2008   Past Medical History:  Diagnosis Date   Allergic rhinitis    Hypertension    Tobacco abuse     Family History  Problem Relation Age of Onset   Heart disease Father    Colon cancer Neg Hx     Past Surgical  History:  Procedure Laterality Date   LAPAROSCOPIC CHOLECYSTECTOMY     Social History   Occupational History   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

## 2023-04-11 ENCOUNTER — Telehealth: Payer: Self-pay | Admitting: Family Medicine

## 2023-04-11 NOTE — Telephone Encounter (Signed)
Patient called in and wanted to know if Dr. Patsy Santos would take on his son Nathan Santos. He stated that he see's Dr. Patsy Santos and would like his son to see him as well. Please advise. Thank you!

## 2023-04-11 NOTE — Telephone Encounter (Signed)
Patient son scheduled.

## 2023-04-11 NOTE — Telephone Encounter (Signed)
Sure.  I am happy to see him.  You can set up a new patient appointment at his convenience - preferably not a Monday.

## 2023-05-16 ENCOUNTER — Ambulatory Visit: Payer: BLUE CROSS/BLUE SHIELD | Admitting: Family Medicine

## 2023-05-16 VITALS — BP 124/80 | HR 97 | Temp 98.6°F | Ht 70.0 in | Wt 272.4 lb

## 2023-05-16 DIAGNOSIS — R002 Palpitations: Secondary | ICD-10-CM | POA: Diagnosis not present

## 2023-05-16 DIAGNOSIS — I10 Essential (primary) hypertension: Secondary | ICD-10-CM

## 2023-05-16 DIAGNOSIS — R000 Tachycardia, unspecified: Secondary | ICD-10-CM

## 2023-05-16 MED ORDER — HYDROCHLOROTHIAZIDE 25 MG PO TABS: 25.0000 mg | ORAL_TABLET | Freq: Every day | ORAL | 3 refills | Status: DC

## 2023-05-16 NOTE — Progress Notes (Unsigned)
    Lela Gell T. Stetson Pelaez, MD, CAQ Sports Medicine [facility] [address] [place], [address]  Phone: [phone]  FAX: [phone]  Nathan Santos - 57 y.o. male  MRN [id number]  Date of Birth: 02/22/1966  Date: 05/16/2023  PCP: Hannah Beat, MD  Referral: Hannah Beat, MD  Chief Complaint  Patient presents with   Hypertension   Subjective:   Nathan Santos is a 57 y.o. very pleasant male patient with Body mass index is 39.08 kg/m. who presents with the following:  F/u BP - he is on losartan 50 mg and hydrochlorothiazide 12.5 mg  Sat, BP was 190/125. Left work and he felt like he was having a cold sweat, lightheaded.  A little bit dizzy.  Mom brought a BP machine - arm 150/90 +  140/86 on my rehceck  BP meds in the morning, both Highly variable when he is taking it. - Not sure if he has missed his medication this week.  BP Readings from Last 3 Encounters:  05/16/23 124/80  12/02/22 (!) 142/80  10/28/22 (!) 140/80    Felt palpitations all weekend long - skipping beats a lot  Did take some motrin for a few days.   Review of Systems is noted in the HPI, as appropriate  Objective:   BP 124/80 (BP Location: Left Arm, Patient Position: Sitting, Cuff Size: Large)   Pulse 97   Temp 98.6 F (37 C) (Temporal)   Ht 5\' 10"  (1.778 m)   Wt 272 lb 6 oz (123.5 kg)   SpO2 97%   BMI 39.08 kg/m   GEN: No acute distress; alert,appropriate. PULM: Breathing comfortably in no respiratory distress PSYCH: Normally interactive.   Laboratory and Imaging Data:  Assessment and Plan:   ***

## 2023-05-17 LAB — BASIC METABOLIC PANEL
BUN: 30 mg/dL — ABNORMAL HIGH (ref 6–23)
CO2: 27 meq/L (ref 19–32)
Calcium: 9.9 mg/dL (ref 8.4–10.5)
Chloride: 99 meq/L (ref 96–112)
Creatinine, Ser: 1.14 mg/dL (ref 0.40–1.50)
GFR: 70.59 mL/min (ref >60.00)
Glucose, Bld: 92 mg/dL (ref 70–99)
Potassium: 4.3 meq/L (ref 3.5–5.1)
Sodium: 138 meq/L (ref 135–145)

## 2023-05-17 LAB — HEPATIC FUNCTION PANEL
ALT: 20 U/L (ref 0–53)
AST: 13 U/L (ref 0–37)
Albumin: 4.5 g/dL (ref 3.5–5.2)
Alkaline Phosphatase: 73 U/L (ref 39–117)
Bilirubin, Direct: 0.1 mg/dL (ref 0.0–0.3)
Total Bilirubin: 0.5 mg/dL (ref 0.2–1.2)
Total Protein: 7.2 g/dL (ref 6.0–8.3)

## 2023-05-17 LAB — CBC WITH DIFFERENTIAL/PLATELET
Basophils Absolute: 0.1 10*3/uL (ref 0.0–0.1)
Basophils Relative: 0.8 % (ref 0.0–3.0)
Eosinophils Absolute: 0.3 10*3/uL (ref 0.0–0.7)
Eosinophils Relative: 2.6 % (ref 0.0–5.0)
HCT: 51.5 % (ref 39.0–52.0)
Hemoglobin: 17.2 g/dL — ABNORMAL HIGH (ref 13.0–17.0)
Lymphocytes Relative: 25.5 % (ref 12.0–46.0)
Lymphs Abs: 2.5 10*3/uL (ref 0.7–4.0)
MCHC: 33.5 g/dL (ref 30.0–36.0)
MCV: 98.1 fL (ref 78.0–100.0)
Monocytes Absolute: 0.8 10*3/uL (ref 0.1–1.0)
Monocytes Relative: 7.9 % (ref 3.0–12.0)
Neutro Abs: 6.1 10*3/uL (ref 1.4–7.7)
Neutrophils Relative %: 63.2 % (ref 43.0–77.0)
Platelets: 291 10*3/uL (ref 150.0–400.0)
RBC: 5.25 Mil/uL (ref 4.22–5.81)
RDW: 13.8 % (ref 11.5–15.5)
WBC: 9.6 10*3/uL (ref 4.0–10.5)

## 2023-05-17 LAB — TSH: TSH: 1.4 u[IU]/mL (ref 0.35–5.50)

## 2023-08-08 ENCOUNTER — Encounter: Payer: Self-pay | Admitting: Cardiology

## 2023-08-08 ENCOUNTER — Ambulatory Visit: Payer: BLUE CROSS/BLUE SHIELD

## 2023-08-08 ENCOUNTER — Ambulatory Visit: Payer: BLUE CROSS/BLUE SHIELD | Attending: Cardiology | Admitting: Cardiology

## 2023-08-08 VITALS — BP 144/84 | HR 76 | Ht 70.0 in | Wt 284.4 lb

## 2023-08-08 DIAGNOSIS — I459 Conduction disorder, unspecified: Secondary | ICD-10-CM

## 2023-08-08 DIAGNOSIS — Z7289 Other problems related to lifestyle: Secondary | ICD-10-CM | POA: Diagnosis not present

## 2023-08-08 DIAGNOSIS — I10 Essential (primary) hypertension: Secondary | ICD-10-CM

## 2023-08-08 NOTE — Patient Instructions (Addendum)
 Medication Instructions:   Your physician recommends that you continue on your current medications as directed. Please refer to the Current Medication list given to you today.  *If you need a refill on your cardiac medications before your next appointment, please call your pharmacy*   Lab Work:  None Ordered  If you have labs (blood work) drawn today and your tests are completely normal, you will receive your results only by: MyChart Message (if you have MyChart) OR A paper copy in the mail If you have any lab test that is abnormal or we need to change your treatment, we will call you to review the results.   Testing/Procedures:  Your physician has recommended that you wear a Zio monitor.   This monitor is a medical device that records the heart's electrical activity. Doctors most often use these monitors to diagnose arrhythmias. Arrhythmias are problems with the speed or rhythm of the heartbeat. The monitor is a small device applied to your chest. You can wear one while you do your normal daily activities. While wearing this monitor if you have any symptoms to push the button and record what you felt. Once you have worn this monitor for the period of time provider prescribed (Usually 14 days), you will return the monitor device in the postage paid box. Once it is returned they will download the data collected and provide [place] with a report which the provider will then review and we will call you with those results. Important tips:  Avoid showering during the first 24 hours of wearing the monitor. Avoid excessive sweating to help maximize wear time. Do not submerge the device, no hot tubs, and no swimming pools. Keep any lotions or oils away from the patch. After 24 hours you may shower with the patch on. Take brief showers with your back facing the shower head.  Do not remove patch once it has been placed because that will interrupt data and decrease adhesive wear time. Push the button  when you have any symptoms and write down what you were feeling. Once you have completed wearing your monitor, remove and place into box which has postage paid and place in your outgoing mailbox.  If for some reason you have misplaced your box then call our office and we can provide another box and/or mail it off for you.      Follow-Up: At [facility], you and your health needs are our priority.  As part of our continuing mission to provide you with exceptional heart care, we have created designated Provider Care Teams.  These Care Teams include your primary Cardiologist (physician) and Advanced Practice Providers (APPs -  Physician Assistants and Nurse Practitioners) who all work together to provide you with the care you need, when you need it.  We recommend signing up for the patient portal called "MyChart".  Sign up information is provided on this After Visit Summary.  MyChart is used to connect with patients for Virtual Visits (Telemedicine).  Patients are able to view lab/test results, encounter notes, upcoming appointments, etc.  Non-urgent messages can be sent to your provider as well.   To learn more about what you can do with MyChart, go to [email or web address].    Your next appointment:    8 - 10 weeks  Provider:   You may see Debbe Odea, MD or one of the following Advanced Practice Providers on your designated Care Team:   Nicolasa Ducking, NP Eula Listen, PA-C Cadence Fransico Michael, PA-C [place],  NP Carlos Levering, NP

## 2023-08-08 NOTE — Progress Notes (Signed)
 Cardiology Office Note:    Date:  08/08/2023   ID:  Nathan Santos, DOB February 23, 1966, MRN [id number]  PCP:  Hannah Beat, MD   [facility] Providers Cardiologist:  Debbe Odea, MD     Referring MD: Hannah Beat, MD   Chief Complaint  Patient presents with   New Patient (Initial Visit)    Referred for cardiac evaluation of Palpitations and Hypertension.  Previous cardiac work up in 2008 with unknown provider.    Nathan Santos is a 58 y.o. male who is being seen today for the evaluation of palpitations at the request of Copland, Karleen Hampshire, MD.   History of Present Illness:    Nathan Santos is a 58 y.o. male with a hx of hypertension, former smoker x 25 years, currently uses e-cigarettes presenting with skipped heartbeats.  Patient states having skipped heartbeats over the past 6 months.  Symptoms seem to have worsened of late.  Denies dizziness, palpitations, chest pain or shortness of breath.  Had some heartbeat skipping back in 2007, underwent cardiac testing with stress test.  Was prescribed a medication to help suppress heartbeats.  He is not sure of the name.  States smoking in the past, stopped about 8 to 9 years ago.  Currently vapes, began vaping a year ago.  Father had a heart attack in his 57s.  Past Medical History:  Diagnosis Date   Allergic rhinitis    Hypertension    Tobacco abuse     Past Surgical History:  Procedure Laterality Date   LAPAROSCOPIC CHOLECYSTECTOMY      Current Medications: Current Meds  Medication Sig   hydrochlorothiazide (HYDRODIURIL) 25 MG tablet Take 1 tablet (25 mg total) by mouth daily.   losartan (COZAAR) 50 MG tablet TAKE 1 TABLET BY MOUTH EVERY DAY   Multiple Vitamin (MULTIVITAMIN) tablet Take 1 tablet by mouth daily.     Allergies:   Patient has no known allergies.   Social History   Socioeconomic History   Marital status: Married    Spouse name: Not on file   Number of children: 1   Years of  education: Not on file   Highest education level: 12th grade  Occupational History   Occupation: PLASTICS RECYCLING  Tobacco Use   Smoking status: Former    Types: E-cigarettes   Smokeless tobacco: Current    Types: Snuff   Tobacco comments:    Quit smoking cigarettes in 2022 with 25 year history   Vaping Use   Vaping status: Never Used  Substance and Sexual Activity   Alcohol use: Yes    Comment: occ 3 times a month   Drug use: No   Sexual activity: Not Currently  Other Topics Concern   Not on file  Social History Narrative   REGULAR EXERCISE- YES   Social Drivers of Health   Financial Resource Strain: Low Risk  (05/16/2023)   Overall Financial Resource Strain (CARDIA)    Difficulty of Paying Living Expenses: Not very hard  Food Insecurity: No Food Insecurity (05/16/2023)   Hunger Vital Sign    Worried About Running Out of Food in the Last Year: Never true    Ran Out of Food in the Last Year: Never true  Transportation Needs: No Transportation Needs (05/16/2023)   PRAPARE - Administrator, Civil Service (Medical): No    Lack of Transportation (Non-Medical): No  Physical Activity: Sufficiently Active (05/16/2023)   Exercise Vital Sign    Days of Exercise  per Week: 5 days    Minutes of Exercise per Session: 30 min  Stress: Stress Concern Present (05/16/2023)   [employer] of Occupational Health - Occupational Stress Questionnaire    Feeling of Stress : To some extent  Social Connections: Moderately Isolated (05/16/2023)   Social Connection and Isolation Panel [NHANES]    Frequency of Communication with Friends and Family: More than three times a week    Frequency of Social Gatherings with Friends and Family: Once a week    Attends Religious Services: More than 4 times per year    Active Member of [employer] or Organizations: No    Attends Engineer, structural: Not on file    Marital Status: Divorced     Family History: The patient's family  history includes Heart attack in his father; Heart disease in his father. There is no history of Colon cancer.  ROS:   Please see the history of present illness.     All other systems reviewed and are negative.  EKGs/Labs/Other Studies Reviewed:    The following studies were reviewed today:  EKG Interpretation Date/Time:  Monday August 08 2023 11:05:37 EST Ventricular Rate:  76 PR Interval:  152 QRS Duration:  90 QT Interval:  450 QTC Calculation: 506 R Axis:   -8  Text Interpretation: Normal sinus rhythm Possible Left atrial enlargement ST & T wave abnormality, consider anterior ischemia Prolonged QT Confirmed by Debbe Odea ([id number]) on 08/08/2023 11:20:01 AM    Recent Labs: 05/16/2023: ALT 20; BUN 30; Creatinine, Ser 1.14; Hemoglobin 17.2; Platelets 291.0; Potassium 4.3; Sodium 138; TSH 1.40  Recent Lipid Panel    Component Value Date/Time   CHOL 147 10/21/2022 0926   CHOL 192 07/27/2013 0506   TRIG 61.0 10/21/2022 0926   TRIG 142 07/27/2013 0506   HDL 47.80 10/21/2022 0926   HDL 47 07/27/2013 0506   CHOLHDL 3 10/21/2022 0926   VLDL 12.2 10/21/2022 0926   VLDL 28 07/27/2013 0506   LDLCALC 87 10/21/2022 0926   LDLCALC 117 (H) 07/27/2013 0506   LDLDIRECT 100.0 07/16/2014 0806     Risk Assessment/Calculations:          Physical Exam:    VS:  BP (!) 144/84 (BP Location: Left Arm, Patient Position: Sitting, Cuff Size: Large)   Pulse 76   Ht 5\' 10"  (1.778 m)   Wt 284 lb 6.4 oz (129 kg)   SpO2 93%   BMI 40.81 kg/m     Wt Readings from Last 3 Encounters:  08/08/23 284 lb 6.4 oz (129 kg)  05/16/23 272 lb 6 oz (123.5 kg)  12/07/22 250 lb (113.4 kg)     GEN:  Well nourished, well developed in no acute distress HEENT: Normal NECK: No JVD; No carotid bruits CARDIAC: RRR, no murmurs, rubs, gallops RESPIRATORY:  Clear to auscultation without rales, wheezing or rhonchi  ABDOMEN: Soft, non-tender, non-distended MUSCULOSKELETAL:  No edema; No deformity   SKIN: Warm and dry NEUROLOGIC:  Alert and oriented x 3 PSYCHIATRIC:  Normal affect   ASSESSMENT:    1. Skipped heart beats   2. Primary hypertension   3. Engages in vaping    PLAN:    In order of problems listed above:  Skipped heartbeats, likely PACs.  E-cigarette use/vaping possibly contributing.  Place cardiac monitor to evaluate any significant arrhythmias.  E-cigarette cessation advised.  Will consider AV nodal agents/beta-blocker after cardiac monitor and patient's symptoms. Hypertension, BP elevated today, previously controlled at PCPs office.  Continue  losartan 50 mg daily, HCTZ 25 mg daily.  Plan to either titrate losartan or start beta-blocker at follow-up visit based on cardiac monitor results if BP stays elevated. Engages in vaping, cessation advised.  Follow-up after cardiac monitor      Medication Adjustments/Labs and Tests Ordered: Current medicines are reviewed at length with the patient today.  Concerns regarding medicines are outlined above.  Orders Placed This Encounter  Procedures   LONG TERM MONITOR (3-14 DAYS)   EKG 12-Lead   No orders of the defined types were placed in this encounter.   Patient Instructions  Medication Instructions:   Your physician recommends that you continue on your current medications as directed. Please refer to the Current Medication list given to you today.  *If you need a refill on your cardiac medications before your next appointment, please call your pharmacy*   Lab Work:  None Ordered  If you have labs (blood work) drawn today and your tests are completely normal, you will receive your results only by: MyChart Message (if you have MyChart) OR A paper copy in the mail If you have any lab test that is abnormal or we need to change your treatment, we will call you to review the results.   Testing/Procedures:  Your physician has recommended that you wear a Zio monitor.   This monitor is a medical device that  records the heart's electrical activity. Doctors most often use these monitors to diagnose arrhythmias. Arrhythmias are problems with the speed or rhythm of the heartbeat. The monitor is a small device applied to your chest. You can wear one while you do your normal daily activities. While wearing this monitor if you have any symptoms to push the button and record what you felt. Once you have worn this monitor for the period of time provider prescribed (Usually 14 days), you will return the monitor device in the postage paid box. Once it is returned they will download the data collected and provide [place] with a report which the provider will then review and we will call you with those results. Important tips:  Avoid showering during the first 24 hours of wearing the monitor. Avoid excessive sweating to help maximize wear time. Do not submerge the device, no hot tubs, and no swimming pools. Keep any lotions or oils away from the patch. After 24 hours you may shower with the patch on. Take brief showers with your back facing the shower head.  Do not remove patch once it has been placed because that will interrupt data and decrease adhesive wear time. Push the button when you have any symptoms and write down what you were feeling. Once you have completed wearing your monitor, remove and place into box which has postage paid and place in your outgoing mailbox.  If for some reason you have misplaced your box then call our office and we can provide another box and/or mail it off for you.      Follow-Up: At [facility], you and your health needs are our priority.  As part of our continuing mission to provide you with exceptional heart care, we have created designated Provider Care Teams.  These Care Teams include your primary Cardiologist (physician) and Advanced Practice Providers (APPs -  Physician Assistants and Nurse Practitioners) who all work together to provide you with the care you need,  when you need it.  We recommend signing up for the patient portal called "MyChart".  Sign up information is provided on this  After Visit Summary.  MyChart is used to connect with patients for Virtual Visits (Telemedicine).  Patients are able to view lab/test results, encounter notes, upcoming appointments, etc.  Non-urgent messages can be sent to your provider as well.   To learn more about what you can do with MyChart, go to [email or web address].    Your next appointment:    8 - 10 weeks  Provider:   You may see Debbe Odea, MD or one of the following Advanced Practice Providers on your designated Care Team:   Nicolasa Ducking, NP Eula Listen, PA-C Cadence Fransico Michael, PA-C Charlsie Quest, NP Carlos Levering, NP    Signed, Debbe Odea, MD  08/08/2023 12:18 PM     [facility]

## 2023-10-03 ENCOUNTER — Ambulatory Visit: Payer: BLUE CROSS/BLUE SHIELD | Admitting: Cardiology

## 2023-10-10 ENCOUNTER — Encounter: Payer: Self-pay | Admitting: *Deleted

## 2024-01-04 ENCOUNTER — Other Ambulatory Visit: Payer: Self-pay | Admitting: Family Medicine

## 2024-01-04 NOTE — Telephone Encounter (Signed)
 Please schedule CPE with fasting labs prior with Dr. Patsy Lager.

## 2024-01-04 NOTE — Telephone Encounter (Signed)
 Lvmtcb, sent mychart message

## 2024-02-08 ENCOUNTER — Encounter: Admitting: Family Medicine

## 2024-02-20 NOTE — Progress Notes (Unsigned)
 Aarik Blank T. Dashauna Heymann, MD, CAQ Sports Medicine [facility] [address] [place], [address]  Phone: [phone]  FAX: [phone]  Nathan Santos - 58 y.o. male  MRN [id number]  Date of Birth: 10-Feb-1966  Date: 02/22/2024  PCP: Watt Mirza, MD  Referral: Watt Mirza, MD  No chief complaint on file.  Patient Care Team: Watt Mirza, MD as PCP - General Darliss Rogue, MD as PCP - Cardiology (Cardiology) Subjective:   Nathan Santos is a 58 y.o. pleasant patient who presents with the following:  Discussed the use of AI scribe software for clinical note transcription with the patient, who gave verbal consent to proceed.  History of Present Illness     Preventative Health Maintenance Visit:  Health Maintenance Summary Reviewed and updated, unless pt declines services.  Tobacco History Reviewed.  Smoker Alcohol: No concerns, no excessive use Exercise Habits: Some activity, rec at least 30 mins 5 times a week STD concerns: no risk or activity to increase risk Drug Use: None  COVID-vaccine Prevnar 20 vaccine Colonoscopy Flu vaccine?  HTN: Tolerating all medications without side effects Stable and at goal No CP, no sob. No HA.  BP Readings from Last 3 Encounters:  08/08/23 (!) 144/84  05/16/23 124/80  12/02/22 (!) 142/80    Basic Metabolic Panel:    Component Value Date/Time   NA 138 05/16/2023 1606   NA 137 07/26/2013 1049   K 4.3 05/16/2023 1606   K 4.4 07/26/2013 1049   CL 99 05/16/2023 1606   CL 105 07/26/2013 1049   CO2 27 05/16/2023 1606   CO2 24 07/26/2013 1049   BUN 30 (H) 05/16/2023 1606   BUN 15 07/26/2013 1049   CREATININE 1.14 05/16/2023 1606   CREATININE 0.94 06/06/2015 1527   GLUCOSE 92 05/16/2023 1606   GLUCOSE 94 07/26/2013 1049   CALCIUM 9.9 05/16/2023 1606   CALCIUM 9.2 07/26/2013 1049     Health Maintenance  Topic Date Due   COVID-19 Vaccine (1) Never done    Hepatitis B Vaccines 19-59 Average Risk (1 of 3 - 19+ 3-dose series) Never done   Pneumococcal Vaccine: 50+ Years (1 of 1 - PCV) Never done   Colonoscopy  08/10/2019   INFLUENZA VACCINE  01/20/2024   DTaP/Tdap/Td (3 - Td or Tdap) 08/20/2031   Hepatitis C Screening  Completed   HIV Screening  Completed   Zoster Vaccines- Shingrix   Completed   HPV VACCINES  Aged Out   Meningococcal B Vaccine  Aged Out   Immunization History  Administered Date(s) Administered   Influenza,inj,Quad PF,6+ Mos 07/01/2014   Influenza-Unspecified 04/16/2020, 04/03/2021, 03/22/2022, 03/21/2023   Td 11/20/2007   Tdap 08/19/2021   Zoster Recombinant(Shingrix ) 08/19/2021, 10/29/2021   Patient Active Problem List   Diagnosis Date Noted   Essential hypertension, benign 05/22/2015    Priority: Medium    TOBACCO USE 04/24/2008    Priority: Medium    B12 deficiency 06/06/2015   Allergic conjunctivitis and rhinitis, bilateral 05/22/2015   Erectile dysfunction due to arterial insufficiency 07/02/2014    Past Medical History:  Diagnosis Date   Allergic rhinitis    Hypertension    Tobacco abuse     Past Surgical History:  Procedure Laterality Date   LAPAROSCOPIC CHOLECYSTECTOMY      Family History  Problem Relation Age of Onset   Heart disease Father    Heart attack Father    Colon cancer Neg Hx  Social History   Social History Narrative   REGULAR EXERCISE- YES    Past Medical History, Surgical History, Social History, Family History, Problem List, Medications, and Allergies have been reviewed and updated if relevant.  Review of Systems: Pertinent positives are listed above.  Otherwise, a full 14 point review of systems has been done in full and it is negative except where it is noted positive.  Objective:   There were no vitals taken for this visit. Ideal Body Weight:    Ideal Body Weight:   No results found.    10/21/2022    9:00 AM 08/19/2021    9:00 AM 05/07/2020    8:25 AM 06/23/2017     9:02 AM  Depression screen PHQ 2/9  Decreased Interest 1  0 0  Down, Depressed, Hopeless 0 1 0 0  PHQ - 2 Score 1 1 0 0  Altered sleeping 0     Tired, decreased energy 1     Change in appetite 0     Feeling bad or failure about yourself  0     Trouble concentrating 0     Moving slowly or fidgety/restless 1     Suicidal thoughts 0     PHQ-9 Score 3     Difficult doing work/chores Somewhat difficult        GEN: well developed, well nourished, no acute distress Eyes: conjunctiva and lids normal, PERRLA, EOMI ENT: TM clear, nares clear, oral exam WNL Neck: supple, no lymphadenopathy, no thyromegaly, no JVD Pulm: clear to auscultation and percussion, respiratory effort normal CV: regular rate and rhythm, S1-S2, no murmur, rub or gallop, no bruits, peripheral pulses normal and symmetric, no cyanosis, clubbing, edema or varicosities GI: soft, non-tender; no hepatosplenomegaly, masses; active bowel sounds all quadrants GU: deferred Lymph: no cervical, axillary or inguinal adenopathy MSK: gait normal, muscle tone and strength WNL, no joint swelling, effusions, discoloration, crepitus  SKIN: clear, good turgor, color WNL, no rashes, lesions, or ulcerations Neuro: normal mental status, normal strength, sensation, and motion Psych: alert; oriented to person, place and time, normally interactive and not anxious or depressed in appearance.  All labs reviewed with patient. Results for orders placed or performed in visit on 05/16/23  TSH   Collection Time: 05/16/23  4:06 PM  Result Value Ref Range   TSH 1.40 0.35 - 5.50 uIU/mL  Basic metabolic panel   Collection Time: 05/16/23  4:06 PM  Result Value Ref Range   Sodium 138 135 - 145 mEq/L   Potassium 4.3 3.5 - 5.1 mEq/L   Chloride 99 96 - 112 mEq/L   CO2 27 19 - 32 mEq/L   Glucose, Bld 92 70 - 99 mg/dL   BUN 30 (H) 6 - 23 mg/dL   Creatinine, Ser 8.85 0.40 - 1.50 mg/dL   GFR 29.40 >39.99 mL/min   Calcium 9.9 8.4 - 10.5 mg/dL   Hepatic function panel   Collection Time: 05/16/23  4:06 PM  Result Value Ref Range   Total Bilirubin 0.5 0.2 - 1.2 mg/dL   Bilirubin, Direct 0.1 0.0 - 0.3 mg/dL   Alkaline Phosphatase 73 39 - 117 U/L   AST 13 0 - 37 U/L   ALT 20 0 - 53 U/L   Total Protein 7.2 6.0 - 8.3 g/dL   Albumin 4.5 3.5 - 5.2 g/dL  CBC with Differential/Platelet   Collection Time: 05/16/23  4:06 PM  Result Value Ref Range   WBC 9.6 4.0 - 10.5 K/uL  RBC 5.25 4.22 - 5.81 Mil/uL   Hemoglobin 17.2 (H) 13.0 - 17.0 g/dL   HCT 48.4 60.9 - 47.9 %   MCV 98.1 78.0 - 100.0 fl   MCHC 33.5 30.0 - 36.0 g/dL   RDW 86.1 88.4 - 84.4 %   Platelets 291.0 150.0 - 400.0 K/uL   Neutrophils Relative % 63.2 43.0 - 77.0 %   Lymphocytes Relative 25.5 12.0 - 46.0 %   Monocytes Relative 7.9 3.0 - 12.0 %   Eosinophils Relative 2.6 0.0 - 5.0 %   Basophils Relative 0.8 0.0 - 3.0 %   Neutro Abs 6.1 1.4 - 7.7 K/uL   Lymphs Abs 2.5 0.7 - 4.0 K/uL   Monocytes Absolute 0.8 0.1 - 1.0 K/uL   Eosinophils Absolute 0.3 0.0 - 0.7 K/uL   Basophils Absolute 0.1 0.0 - 0.1 K/uL    Assessment and Plan:     ICD-10-CM   1. Healthcare maintenance  Z00.00      Assessment & Plan   Health Maintenance Exam: The patient's preventative maintenance and recommended screening tests for an annual wellness exam were reviewed in full today. Brought up to date unless services declined.  Counselled on the importance of diet, exercise, and its role in overall health and mortality. The patient's FH and SH was reviewed, including their home life, tobacco status, and drug and alcohol status.  Follow-up in 1 year for physical exam or additional follow-up below.  Disposition: No follow-ups on file.  No orders of the defined types were placed in this encounter.  There are no discontinued medications. No orders of the defined types were placed in this encounter.   Signed,  Jacques DASEN. Darius Lundberg, MD   Allergies as of 02/22/2024   No Known Allergies       Medication List        Accurate as of February 20, 2024  5:06 PM. If you have any questions, ask your nurse or doctor.          diclofenac  75 MG EC tablet Commonly known as: VOLTAREN  Take 1 tablet (75 mg total) by mouth 2 (two) times daily.   hydrochlorothiazide  25 MG tablet Commonly known as: HYDRODIURIL  Take 1 tablet (25 mg total) by mouth daily.   losartan  50 MG tablet Commonly known as: COZAAR  TAKE 1 TABLET BY MOUTH EVERY DAY   multivitamin tablet Take 1 tablet by mouth daily.

## 2024-02-22 ENCOUNTER — Other Ambulatory Visit: Payer: Self-pay | Admitting: Family Medicine

## 2024-02-22 ENCOUNTER — Encounter: Payer: Self-pay | Admitting: Family Medicine

## 2024-02-22 ENCOUNTER — Ambulatory Visit (INDEPENDENT_AMBULATORY_CARE_PROVIDER_SITE_OTHER): Admitting: Family Medicine

## 2024-02-22 VITALS — BP 130/70 | HR 69 | Temp 97.8°F | Ht 70.0 in | Wt 300.0 lb

## 2024-02-22 DIAGNOSIS — Z0000 Encounter for general adult medical examination without abnormal findings: Secondary | ICD-10-CM | POA: Diagnosis not present

## 2024-02-22 DIAGNOSIS — Z79899 Other long term (current) drug therapy: Secondary | ICD-10-CM | POA: Diagnosis not present

## 2024-02-22 DIAGNOSIS — Z13220 Encounter for screening for lipoid disorders: Secondary | ICD-10-CM | POA: Diagnosis not present

## 2024-02-22 DIAGNOSIS — Z1211 Encounter for screening for malignant neoplasm of colon: Secondary | ICD-10-CM

## 2024-02-22 DIAGNOSIS — R7303 Prediabetes: Secondary | ICD-10-CM

## 2024-02-22 DIAGNOSIS — Z125 Encounter for screening for malignant neoplasm of prostate: Secondary | ICD-10-CM

## 2024-02-22 LAB — BASIC METABOLIC PANEL WITH GFR
BUN: 15 mg/dL (ref 6–23)
CO2: 30 meq/L (ref 19–32)
Calcium: 9 mg/dL (ref 8.4–10.5)
Chloride: 100 meq/L (ref 96–112)
Creatinine, Ser: 1.03 mg/dL (ref 0.40–1.50)
GFR: 79.3 mL/min (ref >60.00)
Glucose, Bld: 90 mg/dL (ref 70–99)
Potassium: 4.3 meq/L (ref 3.5–5.1)
Sodium: 138 meq/L (ref 135–145)

## 2024-02-22 LAB — CBC WITH DIFFERENTIAL/PLATELET
Basophils Absolute: 0 K/uL (ref 0.0–0.1)
Basophils Relative: 0.4 % (ref 0.0–3.0)
Eosinophils Absolute: 0.1 K/uL (ref 0.0–0.7)
Eosinophils Relative: 1.9 % (ref 0.0–5.0)
HCT: 45.3 % (ref 39.0–52.0)
Hemoglobin: 15.3 g/dL (ref 13.0–17.0)
Lymphocytes Relative: 25 % (ref 12.0–46.0)
Lymphs Abs: 1.9 K/uL (ref 0.7–4.0)
MCHC: 33.8 g/dL (ref 30.0–36.0)
MCV: 95.3 fl (ref 78.0–100.0)
Monocytes Absolute: 0.6 K/uL (ref 0.1–1.0)
Monocytes Relative: 7.5 % (ref 3.0–12.0)
Neutro Abs: 4.8 K/uL (ref 1.4–7.7)
Neutrophils Relative %: 65.2 % (ref 43.0–77.0)
Platelets: 256 K/uL (ref 150.0–400.0)
RBC: 4.76 Mil/uL (ref 4.22–5.81)
RDW: 14.3 % (ref 11.5–15.5)
WBC: 7.4 K/uL (ref 4.0–10.5)

## 2024-02-22 LAB — LIPID PANEL
Cholesterol: 184 mg/dL (ref 0–200)
HDL: 57 mg/dL (ref >39.00)
LDL Cholesterol: 70 mg/dL (ref 0–99)
NonHDL: 127.27
Total CHOL/HDL Ratio: 3
Triglycerides: 285 mg/dL — ABNORMAL HIGH (ref 0.0–149.0)
VLDL: 57 mg/dL — ABNORMAL HIGH (ref 0.0–40.0)

## 2024-02-22 LAB — HEPATIC FUNCTION PANEL
ALT: 24 U/L (ref 0–53)
AST: 18 U/L (ref 0–37)
Albumin: 4.1 g/dL (ref 3.5–5.2)
Alkaline Phosphatase: 65 U/L (ref 39–117)
Bilirubin, Direct: 0.1 mg/dL (ref 0.0–0.3)
Total Bilirubin: 0.4 mg/dL (ref 0.2–1.2)
Total Protein: 6.9 g/dL (ref 6.0–8.3)

## 2024-02-22 LAB — HEMOGLOBIN A1C: Hgb A1c MFr Bld: 6.1 % (ref 4.6–6.5)

## 2024-02-22 MED ORDER — MOXIFLOXACIN HCL 0.5 % OP SOLN: 1.0000 [drp] | Freq: Three times a day (TID) | OPHTHALMIC | 0 refills | Status: AC

## 2024-02-22 MED ORDER — CITALOPRAM HYDROBROMIDE 10 MG PO TABS: 10.0000 mg | ORAL_TABLET | Freq: Every day | ORAL | 3 refills | Status: DC

## 2024-02-22 MED ORDER — TIRZEPATIDE-WEIGHT MANAGEMENT 2.5 MG/0.5ML ~~LOC~~ SOLN: 2.5000 mg | SUBCUTANEOUS | 3 refills | Status: DC

## 2024-02-22 NOTE — Telephone Encounter (Signed)
 Alternative Requested:WE DO NOT DISPENSE VIALS.   Okay to send in pens?

## 2024-02-22 NOTE — Telephone Encounter (Signed)
 Yes, I must have sent in the wrong thing by accident.  Pens ok

## 2024-02-23 ENCOUNTER — Ambulatory Visit: Payer: Self-pay | Admitting: Family Medicine

## 2024-02-24 ENCOUNTER — Telehealth: Payer: Self-pay | Admitting: Family Medicine

## 2024-02-24 LAB — PSA, TOTAL WITH REFLEX TO PSA, FREE: PSA, Total: 1 ng/mL (ref <=4.0)

## 2024-02-24 NOTE — Telephone Encounter (Signed)
 Copied from CRM [id number]. Topic: Clinical - Prescription Issue >> Feb 24, 2024  4:26 PM Avram MATSU wrote: Reason for CRM: patient stated the pharmacy sent a text stating he needed a new rx for the alternative for tirzepatide  (ZEPBOUND ) 2.5 MG/0.5ML Pen [[id number]] please advise

## 2024-02-25 ENCOUNTER — Other Ambulatory Visit: Payer: Self-pay | Admitting: Family Medicine

## 2024-02-25 MED ORDER — WEGOVY 0.25 MG/0.5ML ~~LOC~~ SOAJ: 0.2500 mg | SUBCUTANEOUS | 2 refills | Status: AC

## 2024-02-25 NOTE — Addendum Note (Signed)
 Addended by: WATT MIRZA on: 02/25/2024 09:31 AM   Modules accepted: Orders

## 2024-02-27 ENCOUNTER — Other Ambulatory Visit (HOSPITAL_COMMUNITY): Payer: Self-pay

## 2024-02-27 ENCOUNTER — Telehealth: Payer: Self-pay

## 2024-02-27 NOTE — Telephone Encounter (Signed)
 Pharmacy Patient Advocate Encounter   Received notification from RX Request Messages that prior authorization for Zepbound  2.5 is required/requested.   Insurance verification completed.   The patient is insured through [facility] .   Per test claim: PA required; PA submitted to above mentioned insurance via Latent Key/confirmation #/EOC [id number] Status is pending

## 2024-02-29 ENCOUNTER — Other Ambulatory Visit (HOSPITAL_COMMUNITY): Payer: Self-pay

## 2024-02-29 NOTE — Telephone Encounter (Signed)
 Pharmacy Patient Advocate Encounter  Received notification from [facility] that Prior Authorization for Zepbound  7.5 has been CANCELLED due to    PA #/Case ID/Reference #: # [id number]  Per test bill, patient has a plan benefit exclusion, product/service not covered.

## 2024-02-29 NOTE — Telephone Encounter (Signed)
 Please alert patient insurance declines zepbound 

## 2024-03-16 ENCOUNTER — Other Ambulatory Visit: Payer: Self-pay | Admitting: Family Medicine

## 2024-03-31 ENCOUNTER — Other Ambulatory Visit: Payer: Self-pay | Admitting: Family Medicine

## 2024-07-03 ENCOUNTER — Ambulatory Visit: Payer: Self-pay

## 2024-07-03 NOTE — Telephone Encounter (Signed)
 FYI Only or Action Required?: FYI only for provider: no appointments available, proceeding to urgent care.  Patient was last seen in primary care on 02/22/2024 by Watt Mirza, MD.  Called Nurse Triage reporting Fever.  Symptoms began 2 days ago.  Interventions attempted: OTC medications: Nyquil and Rest, hydration, or home remedies.  Symptoms are: gradually worsening.  Triage Disposition: See Physician Within 24 Hours  Patient/caregiver understands and will follow disposition?: Yes   Reason for Disposition  [1] Sinus pain (not just congestion) AND [2] fever  Answer Assessment - Initial Assessment Questions 1. LOCATION: Where does it hurt?      Generalized body aches 2. ONSET: When did the sinus pain start?  (e.g., hours, days)       2 days  3. SEVERITY: How bad is the pain?   (Scale 0-10; or none, mild, moderate or severe)     achy 4. RECURRENT SYMPTOM: Have you ever had sinus problems before? If Yes, ask: When was the last time? and What happened that time?       5. NASAL CONGESTION: Is the nose blocked? If Yes, ask: Can you open it or must you breathe through your mouth?     Yes-post nasal drip 6. NASAL DISCHARGE: Do you have discharge from your nose? If so ask, What color?      7. FEVER: Do you have a fever? If Yes, ask: What is it, how was it measured, and when did it start?      101.8 yesterday, has not measured today but feels  8. OTHER SYMPTOMS: Do you have any other symptoms? (e.g., sore throat, cough, earache, difficulty breathing)     Nasal and chest congestion, mild cough of green 9. PREGNANCY: Is there any chance you are pregnant? When was your last menstrual period?  Protocols used: Sinus Pain or Congestion-A-AH Copied from CRM #[id number]. Topic: Clinical - Red Word Triage >> Jul 03, 2024  2:24 PM Terri MATSU wrote: Red Word that prompted transfer to Nurse Triage: Patient has been experiencing pains/aches, high fever and cold chills  since yesterday

## 2024-07-10 ENCOUNTER — Other Ambulatory Visit: Payer: Self-pay | Admitting: Family Medicine
